# Patient Record
Sex: Female | Born: 1990 | Race: White | Hispanic: No | Marital: Married | Smoking: Former smoker
Health system: Southern US, Community
[De-identification: ages and names within clinical notes are randomized; demographics above are authoritative.]

## PROBLEM LIST (undated history)

## (undated) DIAGNOSIS — R519 Headache, unspecified: Secondary | ICD-10-CM

## (undated) DIAGNOSIS — R569 Unspecified convulsions: Secondary | ICD-10-CM

## (undated) DIAGNOSIS — R51 Headache: Secondary | ICD-10-CM

## (undated) HISTORY — DX: Headache, unspecified: R51.9

## (undated) HISTORY — DX: Headache: R51

## (undated) HISTORY — PX: TONSILLECTOMY: SUR1361

## (undated) HISTORY — PX: FRACTURE SURGERY: SHX138

---

## 2008-02-20 ENCOUNTER — Emergency Department (HOSPITAL_BASED_OUTPATIENT_CLINIC_OR_DEPARTMENT_OTHER): Admission: EM | Admit: 2008-02-20 | Discharge: 2008-02-20 | Payer: Self-pay | Admitting: Emergency Medicine

## 2008-03-06 ENCOUNTER — Emergency Department (HOSPITAL_BASED_OUTPATIENT_CLINIC_OR_DEPARTMENT_OTHER): Admission: EM | Admit: 2008-03-06 | Discharge: 2008-03-06 | Payer: Self-pay | Admitting: Emergency Medicine

## 2009-09-23 ENCOUNTER — Ambulatory Visit: Payer: Self-pay | Admitting: Interventional Radiology

## 2009-09-23 ENCOUNTER — Emergency Department (HOSPITAL_BASED_OUTPATIENT_CLINIC_OR_DEPARTMENT_OTHER): Admission: EM | Admit: 2009-09-23 | Discharge: 2009-09-23 | Payer: Self-pay | Admitting: Emergency Medicine

## 2010-09-26 LAB — BASIC METABOLIC PANEL
CO2: 25 mEq/L (ref 19–32)
Calcium: 9.5 mg/dL (ref 8.4–10.5)
Chloride: 108 mEq/L (ref 96–112)
Creatinine, Ser: 0.7 mg/dL (ref 0.4–1.2)
GFR calc non Af Amer: >60 mL/min (ref >60)
Glucose, Bld: 86 mg/dL (ref 70–99)
Potassium: 3.9 mEq/L (ref 3.5–5.1)

## 2010-09-26 LAB — CK: Total CK: 114 U/L (ref 7–177)

## 2016-07-23 ENCOUNTER — Emergency Department (HOSPITAL_BASED_OUTPATIENT_CLINIC_OR_DEPARTMENT_OTHER): Payer: Medicaid Other

## 2016-07-23 ENCOUNTER — Emergency Department (HOSPITAL_BASED_OUTPATIENT_CLINIC_OR_DEPARTMENT_OTHER)
Admission: EM | Admit: 2016-07-23 | Discharge: 2016-07-23 | Disposition: A | Discharge status: Home / Self Care | Payer: Medicaid Other | Attending: Emergency Medicine | Admitting: Emergency Medicine

## 2016-07-23 ENCOUNTER — Encounter (HOSPITAL_BASED_OUTPATIENT_CLINIC_OR_DEPARTMENT_OTHER): Payer: Self-pay | Admitting: Emergency Medicine

## 2016-07-23 DIAGNOSIS — W010XXA Fall on same level from slipping, tripping and stumbling without subsequent striking against object, initial encounter: Secondary | ICD-10-CM | POA: Diagnosis not present

## 2016-07-23 DIAGNOSIS — Y999 Unspecified external cause status: Secondary | ICD-10-CM | POA: Diagnosis not present

## 2016-07-23 DIAGNOSIS — Y92009 Unspecified place in unspecified non-institutional (private) residence as the place of occurrence of the external cause: Secondary | ICD-10-CM | POA: Insufficient documentation

## 2016-07-23 DIAGNOSIS — S99911A Unspecified injury of right ankle, initial encounter: Secondary | ICD-10-CM | POA: Diagnosis present

## 2016-07-23 DIAGNOSIS — M25571 Pain in right ankle and joints of right foot: Secondary | ICD-10-CM | POA: Diagnosis not present

## 2016-07-23 DIAGNOSIS — Y939 Activity, unspecified: Secondary | ICD-10-CM | POA: Diagnosis not present

## 2016-07-23 DIAGNOSIS — Z79899 Other long term (current) drug therapy: Secondary | ICD-10-CM | POA: Diagnosis not present

## 2016-07-23 HISTORY — DX: Unspecified convulsions: R56.9

## 2016-07-23 MED ORDER — TRAMADOL HCL 50 MG PO TABS: 50.0000 mg | ORAL_TABLET | Freq: Four times a day (QID) | ORAL | 0 refills | Status: DC | PRN

## 2016-07-23 MED ORDER — IBUPROFEN 400 MG PO TABS
400.0000 mg | ORAL_TABLET | Freq: Once | ORAL | Status: AC | PRN
  Administered 2016-07-23: 400 mg via ORAL
  Filled 2016-07-23: qty 1

## 2016-07-23 MED ORDER — DICLOFENAC SODIUM 50 MG PO TBEC: 50.0000 mg | DELAYED_RELEASE_TABLET | Freq: Two times a day (BID) | ORAL | 0 refills | Status: DC

## 2016-07-23 NOTE — ED Notes (Signed)
Pt given d/c instructions as per chart. Rx x 2 with precautions. Verbalizes understanding. No questions. 

## 2016-07-23 NOTE — ED Notes (Signed)
Pt states she slipped on the ice Thursday and injured her right ankle. C/O pain to same. Hope, NP in with pt.

## 2016-07-23 NOTE — ED Notes (Signed)
ED Provider at bedside. 

## 2016-07-23 NOTE — ED Provider Notes (Signed)
[facility]   CSN: [id number] Arrival date & time: 07/23/16  1834  By signing my name below, I, Sheri Perry, attest that this documentation has been prepared under the direction and in the presence of  [facility] M. Damian LeavellNeese, NP. Electronically Signed: Doreatha MartinEva Perry, ED Scribe. 07/23/16. 10:03 PM.    History   Chief Complaint Chief Complaint  Patient presents with  . Ankle Pain    HPI Sheri Perry is a 26 y.o. female who presents to the Emergency Department complaining of moderate, sharp right ankle pain s/p fall that occurred 2 days ago. Pt states she slipped in the snow, twisted her right ankle and fell, causing her injury. She denies LOC or head injury. Pt states pain is worsened with weight bearing and ambulation. She reports she has been taking Tylenol with out adequate control of pain. She denies additional injuries.   The history is provided by the patient. No language interpreter was used.  Ankle Pain   The incident occurred 2 days ago. The incident occurred at home. The injury mechanism was a fall. The pain is present in the right ankle. The quality of the pain is described as sharp. The pain is moderate. The pain has been constant since onset. Pertinent negatives include no inability to bear weight. The symptoms are aggravated by bearing weight, activity and palpation. She has tried nothing for the symptoms. The treatment provided no relief.    Past Medical History:  Diagnosis Date  . Seizure (HCC)     There are no active problems to display for this patient.   Past Surgical History:  Procedure Laterality Date  . FRACTURE SURGERY    . TONSILLECTOMY      OB History    No data available       Home Medications    Prior to Admission medications   Medication Sig Start Date End Date Taking? Authorizing Provider  amitriptyline (ELAVIL) 10 MG tablet Take 10 mg by mouth at bedtime.   Yes Historical Provider, MD  gabapentin (NEURONTIN) 100 MG  capsule Take 100 mg by mouth 3 (three) times daily.   Yes Historical Provider, MD  diclofenac (VOLTAREN) 50 MG EC tablet Take 1 tablet (50 mg total) by mouth 2 (two) times daily. 07/23/16   Sammi Stolarz Orlene OchM Anab Vivar, NP  traMADol (ULTRAM) 50 MG tablet Take 1 tablet (50 mg total) by mouth every 6 (six) hours as needed. 07/23/16   Jilliann Subramanian Orlene OchM Danylah Holden, NP    Family History No family history on file.  Social History Social History  Substance Use Topics  . Smoking status: Never Smoker  . Smokeless tobacco: Never Used     Comment: vape inhale  . Alcohol use No     Allergies   Latuda [lurasidone hcl]   Review of Systems Review of Systems  Musculoskeletal: Positive for arthralgias.       Right ankle pain  Neurological: Negative for syncope.  All other systems reviewed and are negative.    Physical Exam Updated Vital Signs BP 122/80 (BP Location: Right Arm)   Pulse 108   Temp 98.2 F (36.8 C) (Oral)   Resp 16   Ht 5\' 5"  (1.651 m)   Wt 88.5 kg   SpO2 98%   BMI 32.45 kg/m   Physical Exam  Constitutional: She appears well-developed and well-nourished.  HENT:  Head: Normocephalic.  Eyes: Conjunctivae are normal.  Cardiovascular: Normal rate.   Pulmonary/Chest: Effort normal. No respiratory distress.  Abdominal: She exhibits  no distension.  Musculoskeletal: Normal range of motion. She exhibits tenderness.  Pedal pulses 2+. Adequate circulation. Tenderness over the lateral aspect of the right ankle.   Neurological: She is alert.  Skin: Skin is warm and dry.  Psychiatric: She has a normal mood and affect. Her behavior is normal.  Nursing note and vitals reviewed.    ED Treatments / Results   DIAGNOSTIC STUDIES: Oxygen Saturation is 98% on RA, normal by my interpretation.    COORDINATION OF CARE: 9:56 PM Discussed treatment plan with pt at bedside which includes XR and pt agreed to plan.    Labs (all labs ordered are listed, but only abnormal results are displayed) Labs Reviewed - No  data to display   Radiology Dg Ankle Complete Right  Result Date: 07/23/2016 CLINICAL DATA:  [place], twisted ankle EXAM: RIGHT ANKLE - COMPLETE 3+ VIEW COMPARISON:  None. FINDINGS: Ankle mortise intact. The talar dome is normal. No malleolar fracture. The calcaneus is normal. IMPRESSION: No fracture or dislocation. Electronically Signed   By: Genevive Bi M.D.   On: 07/23/2016 19:57    Procedures Procedures (including critical care time)  Medications Ordered in ED Medications  ibuprofen (ADVIL,MOTRIN) tablet 400 mg (400 mg Oral Given 07/23/16 2055)     Initial Impression / Assessment and Plan / ED Course  I have reviewed the triage vital signs and the nursing notes.  Pertinent imaging results that were available during my care of the patient were reviewed by me and considered in my medical decision making (see chart for details).   Patient X-Ray negative for obvious fracture or dislocation.  Pt advised to follow up with PCP as needed or with worsening symptoms for orthopedic referral. Patient given Lenora Boys splint and crutches while in ED, conservative therapy recommended and discussed. Patient will be discharged home & is agreeable with above plan. Returns precautions discussed. Pt appears safe for discharge.   Final Clinical Impressions(s) / ED Diagnoses   Final diagnoses:  Acute right ankle pain    New Prescriptions Discharge Medication List as of 07/23/2016 10:14 PM    START taking these medications   Details  diclofenac (VOLTAREN) 50 MG EC tablet Take 1 tablet (50 mg total) by mouth 2 (two) times daily., Starting Sat 07/23/2016, Print    traMADol (ULTRAM) 50 MG tablet Take 1 tablet (50 mg total) by mouth every 6 (six) hours as needed., Starting Sat 07/23/2016, Print        I personally performed the services described in this documentation, which was scribed in my presence. The recorded information has been reviewed and is accurate.    [address] [place],  [place] 07/24/16 4132    Rolan Bucco, MD 07/24/16 [phone]

## 2016-07-23 NOTE — ED Triage Notes (Signed)
Pt slipped on ice on Thursday, injuring right ankle.  Swelling and bruising noted to lateral right ankle.

## 2017-02-28 ENCOUNTER — Encounter (HOSPITAL_COMMUNITY): Payer: Self-pay | Admitting: Emergency Medicine

## 2017-02-28 ENCOUNTER — Emergency Department (HOSPITAL_COMMUNITY)
Admission: EM | Admit: 2017-02-28 | Discharge: 2017-02-28 | Disposition: A | Discharge status: Home / Self Care | Payer: Medicaid Other | Attending: Emergency Medicine | Admitting: Emergency Medicine

## 2017-02-28 DIAGNOSIS — F445 Conversion disorder with seizures or convulsions: Secondary | ICD-10-CM | POA: Diagnosis not present

## 2017-02-28 DIAGNOSIS — Z79899 Other long term (current) drug therapy: Secondary | ICD-10-CM | POA: Insufficient documentation

## 2017-02-28 DIAGNOSIS — G43009 Migraine without aura, not intractable, without status migrainosus: Secondary | ICD-10-CM | POA: Insufficient documentation

## 2017-02-28 DIAGNOSIS — R4182 Altered mental status, unspecified: Secondary | ICD-10-CM | POA: Diagnosis present

## 2017-02-28 LAB — CBG MONITORING, ED: GLUCOSE-CAPILLARY: 97 mg/dL (ref 65–99)

## 2017-02-28 MED ORDER — DEXAMETHASONE 2 MG PO TABS
ORAL_TABLET | ORAL | Status: AC
  Filled 2017-02-28: qty 5

## 2017-02-28 MED ORDER — ACETAMINOPHEN 500 MG PO TABS
1000.0000 mg | ORAL_TABLET | Freq: Once | ORAL | Status: AC
  Administered 2017-02-28: 1000 mg via ORAL
  Filled 2017-02-28: qty 2

## 2017-02-28 MED ORDER — METOCLOPRAMIDE HCL 10 MG PO TABS
10.0000 mg | ORAL_TABLET | Freq: Once | ORAL | Status: AC
  Administered 2017-02-28: 10 mg via ORAL
  Filled 2017-02-28: qty 1

## 2017-02-28 MED ORDER — DEXAMETHASONE 4 MG PO TABS
10.0000 mg | ORAL_TABLET | Freq: Once | ORAL | Status: AC
  Administered 2017-02-28: 10 mg via ORAL

## 2017-02-28 NOTE — ED Triage Notes (Addendum)
Per EMS, pt noted wandering confused around office, speaking nonsensically x 5-10 minutes today, dizziness, photophobia, headache onset today. Pt does not remember episode. Hx of many similar episodes. Currently feeling mental cloudiness, which is normal after similar episodes. Pt has chiari malformation and hx pseudoseizures x 2-3 years. CN II-XII grossly intact to exam. Sensation symmetrical bilaterally.

## 2017-02-28 NOTE — ED Provider Notes (Signed)
[facility]-EMERGENCY DEPT Provider Note   CSN: [id number] Arrival date & time: 02/28/17  0944     History   Chief Complaint Chief Complaint  Patient presents with  . Altered Mental Status    HPI Sheri Perry is a 26 y.o. female.  The history is provided by the patient and a parent.  26 year old female with a history of depression, anxiety, Chiari malformation 1, pseudoseizures presents to the emergency department for possible seizure-like activity. This occurred approximately 8 hours prior to arrival. Patient was at her new job, training, when she felt headache coming on, photophobia, and blacked out. According to the bystanders patient had no change in her mental status and was minimally responsive and shaking. No urinary or bowel incontinence, no tongue biting. Patient denied any recent fevers or infections. Denied any recent head trauma. Currently endorses a typical migraine headache which are associated with these types of episodes. No other alleviating or aggravating factors. Denies any other physical complaints.  On Amitriptyline 70 -90 mg nightly and gabapentin for Chiari Malformation I.     Past Medical History:  Diagnosis Date  . Seizure (HCC)     There are no active problems to display for this patient.   Past Surgical History:  Procedure Laterality Date  . FRACTURE SURGERY    . TONSILLECTOMY      OB History    No data available       Home Medications    Prior to Admission medications   Medication Sig Start Date End Date Taking? Authorizing Provider  amitriptyline (ELAVIL) 10 MG tablet Take 90 mg by mouth at bedtime.    Yes [provider]  clobetasol ointment (TEMOVATE) 0.05 % Apply 1 application topically 2 (two) times daily as needed (psoriasis).    Yes [provider]  gabapentin (NEURONTIN) 300 MG capsule Take 600 mg by mouth 3 (three) times daily.   Yes [provider]  omeprazole (PRILOSEC) 20 MG capsule Take 20 mg by mouth  daily.   Yes [provider]  diclofenac (VOLTAREN) 50 MG EC tablet Take 1 tablet (50 mg total) by mouth 2 (two) times daily. Patient not taking: Reported on 02/28/2017 07/23/16   Janne Napoleon, NP  traMADol (ULTRAM) 50 MG tablet Take 1 tablet (50 mg total) by mouth every 6 (six) hours as needed. Patient not taking: Reported on 02/28/2017 07/23/16   Janne Napoleon, NP    Family History History reviewed. No pertinent family history.  Social History Social History  Substance Use Topics  . Smoking status: Never Smoker  . Smokeless tobacco: Never Used     Comment: vape inhale  . Alcohol use No     Allergies   Hydrocodone and Latuda [lurasidone hcl]   Review of Systems Review of Systems  Constitutional: Negative for chills and fever.  HENT: Negative for ear pain and sore throat.   Eyes: Negative for pain and visual disturbance.  Respiratory: Negative for cough and shortness of breath.   Cardiovascular: Negative for chest pain and palpitations.  Gastrointestinal: Negative for abdominal pain and vomiting.  Genitourinary: Negative for dysuria and hematuria.  Musculoskeletal: Negative for arthralgias and back pain.  Skin: Negative for color change and rash.  Neurological: Positive for headaches. Negative for seizures and syncope.  All other systems reviewed and are negative.    Physical Exam Updated Vital Signs BP 109/89 (BP Location: Left Arm)   Pulse (!) 110   Temp 98.1 F (36.7 C) (Oral)   Resp  18   Ht 5\' 5"  (1.651 m)   Wt 86 kg (189 lb 8 oz)   SpO2 100%   BMI 31.53 kg/m   Physical Exam  Constitutional: She is oriented to person, place, and time. She appears well-developed and well-nourished. No distress.  HENT:  Head: Normocephalic and atraumatic.  Nose: Nose normal.  Eyes: Pupils are equal, round, and reactive to light. Conjunctivae and EOM are normal. Right eye exhibits no discharge. Left eye exhibits no discharge. No scleral icterus.  Fundoscopic exam:       The right eye shows no papilledema.       The left eye shows no papilledema.  Neck: Normal range of motion. Neck supple.  Cardiovascular: Normal rate and regular rhythm.  Exam reveals no gallop and no friction rub.   No murmur heard. Pulmonary/Chest: Effort normal and breath sounds normal. No stridor. No respiratory distress. She has no rales.  Abdominal: Soft. She exhibits no distension. There is no tenderness.  Musculoskeletal: She exhibits no edema or tenderness.  Neurological: She is alert and oriented to person, place, and time.  Mental Status: Alert and oriented to person, place, and time. Attention and concentration normal. Speech clear. Recent memory is intact  Cranial Nerves  II Visual Fields: Intact to confrontation. Visual fields intact. III, IV, VI: Pupils equal and reactive to light and near. Full eye movement without nystagmus  V Facial Sensation: Normal. No weakness of masticatory muscles  VII: No facial weakness or asymmetry  VIII Auditory Acuity: Grossly normal  IX/X: The uvula is midline; the palate elevates symmetrically  XI: Normal sternocleidomastoid and trapezius strength  XII: The tongue is midline. No atrophy or fasciculations.   Motor System: Muscle Strength: 5/5 and symmetric in the upper and lower extremities. No pronation or drift.  Muscle Tone: Tone and muscle bulk are normal in the upper and lower extremities.   Reflexes: DTRs: 1+ and symmetrical in all four extremities. Plantar responses are flexor bilaterally.  Coordination: Intact finger-to-nose, heel-to-shin. No tremor.  Sensation: Intact to light touch, and pinprick. Negative Romberg test.  Gait: Routine gait normal.    Skin: Skin is warm and dry. No rash noted. She is not diaphoretic. No erythema.  Psychiatric: She has a normal mood and affect.  Vitals reviewed.    ED Treatments / Results  Labs (all labs ordered are listed, but only abnormal results are displayed) Labs Reviewed  CBG  MONITORING, ED    EKG  EKG Interpretation  Date/Time:  Tuesday February 28 2017 17:27:59 EDT Ventricular Rate:  85 PR Interval:    QRS Duration: 106 QT Interval:  373 QTC Calculation: 444 R Axis:   50 Text Interpretation:  Sinus rhythm Low voltage, precordial leads Baseline wander in lead(s) I III aVL normal intervals NO BLOCKS NO STEMI Confirmed by Drema Pry [phone]) on 02/28/2017 5:56:08 PM       Radiology No results found.  Procedures Procedures (including critical care time)  Medications Ordered in ED Medications  dexamethasone (DECADRON) 2 MG tablet (  Not Given 02/28/17 1751)  acetaminophen (TYLENOL) tablet 1,000 mg (1,000 mg Oral Given 02/28/17 1722)  metoCLOPramide (REGLAN) tablet 10 mg (10 mg Oral Given 02/28/17 1722)  dexamethasone (DECADRON) tablet 10 mg (10 mg Oral Given 02/28/17 1723)     Initial Impression / Assessment and Plan / ED Course  I have reviewed the triage vital signs and the nursing notes.  Pertinent labs & imaging results that were available during my care of the  patient were reviewed by me and considered in my medical decision making (see chart for details).     UPT negative. CBG within normal limits. EKG without evidence of ischemia, dysrhythmia, blocks, prolonged intervals, Brugada, or epsilon waves.  Patient's symptoms appeared to be related to either complex migraine headaches versus psychogenic/stress related.  Patient is currently at her baseline.  Typical migraine headache for the pt. Non focal neuro exam. No recent head trauma. No fever. Doubt meningitis. Doubt intracranial bleed. Doubt IIH. No indication for imaging. Will treat with migraine cocktail and reevaluate.  The patient is safe for discharge with strict return precautions.  Patient reports that she needs a referral for a new neurologist and would also like Behavioral Health referrals. We'll also provide patient with referral to cardiology for Holter monitor.  Final  Clinical Impressions(s) / ED Diagnoses   Final diagnoses:  Pseudoseizure  Migraine without aura and without status migrainosus, not intractable   Disposition: Discharge  Condition: Good  I have discussed the results, Dx and Tx plan with the patient who expressed understanding and agree(s) with the plan. Discharge instructions discussed at great length. The patient was given strict return precautions who verbalized understanding of the instructions. No further questions at time of discharge.    New Prescriptions   No medications on file    Follow Up: [facility] [address]     [address] [place] [address] [phone] Schedule an appointment as soon as possible for a visit     [facility] CARDIOVASC[facility] [address] [place] [address] [phone] Schedule an appointment as soon as possible for a visit  For Holter monitor      Cardama, Amadeo Garnet, MD 02/28/17 1840

## 2017-03-02 NOTE — Progress Notes (Signed)
Cardiology Office Note:    Date:  03/03/2017   ID:  Sheri Perry, DOB Dec 03, 1990, MRN [id number]  PCP:  Regino Bellowamos, Arlene G, MD  Cardiologist:  Norman HerrlichBrian Blonnie Maske, MD   Referring MD: No ref. provider found  ASSESSMENT:    1. Palpitation   2. LOC (loss of consciousness) (HCC)   3. Arnold-Chiari malformation (HCC)    PLAN:    In order of problems listed above:  1. Not a prominent symptom ,appears due to adrenergic blockade and standing lightheadedness Asrecommended by the ED show where Holter monitor and if unremarkable no further cardiac evaluation. 2. She presently does not have a neurologist I gave her referral. She takes multiple centrally active drugs and may be having side effect. 3. At her requestI gave her the name of the neurosurgeon.  Next appointment in 4 weeks    Medication Adjustments/Labs and Tests Ordered: Current medicines are reviewed at length with the patient today.  Concerns regarding medicines are outlined above.  Orders Placed This Encounter  Procedures  . Ambulatory referral to Neurology  . Holter monitor - 48 hour  . EKG 12-Lead   No orders of the defined types were placed in this encounter.    Chief Complaint  Patient presents with  . New Patient (Initial Visit)    per [facility] for cardiac evaluation  . Palpitations    History of Present Illness:    Sheri Perry is a 26 y.o. female with depression, anxiety, Chiari Type 1 malformation and  pseudoseizures  who is being seen today for the evaluation of loss of conscious at the request of [place] [facility] for a Holter monitor.  ED Triage Note: Per EMS, pt noted wandering confused around office, speaking nonsensically x 5-10 minutes today, dizziness, photophobia, headache onset today. Pt does not remember episode. Hx of many similar episodes. Currently feeling mental cloudiness, which is normal after similar episodes.  Prior to her  I reviewed multiple neurologic and ED evaluations and epic  including consultations and on [facility]. She has no history of heart disease. From her description she is not having syncope. She takes high-dose amitriptyline and has postural lightheadedness and palpitation she's not had chest pain shortness of breath syncope or TIA.  She takes multiple centrally active drugs. She has Chiari malformation. Past Medical History:  Diagnosis Date  . Seizure [facility](HCC)     Past Surgical History:  Procedure Laterality Date  . FRACTURE SURGERY    . TONSILLECTOMY      Current Medications: Current Meds  Medication Sig  . amitriptyline (ELAVIL) 50 MG tablet Take 350 mg by mouth at bedtime.   . clobetasol ointment (TEMOVATE) 0.05 % Apply 1 application topically 2 (two) times daily as needed (psoriasis).   Marland Kitchen. diclofenac (VOLTAREN) 50 MG EC tablet Take 1 tablet (50 mg total) by mouth 2 (two) times daily.  Marland Kitchen. gabapentin (NEURONTIN) 300 MG capsule Take 300 mg by mouth 3 (three) times daily.   Marland Kitchen. omeprazole (PRILOSEC) 20 MG capsule Take 20 mg by mouth daily.  . traMADol (ULTRAM) 50 MG tablet Take 1 tablet (50 mg total) by mouth every 6 (six) hours as needed.     Allergies:   Hydrocodone and Latuda [lurasidone hcl]   Social History   Social History  . Marital status: Single    Spouse name: N/A  . Number of children: N/A  . Years of education: N/A   Social History Main Topics  . Smoking status: Former Smoker  Packs/day: 2.00  . Smokeless tobacco: Never Used     Comment: quit 5 years ago   . Alcohol use No  . Drug use: No  . Sexual activity: Not Asked   Other Topics Concern  . None   Social History Narrative  . None     Family History: The patient's family history is not on file. She was adopted.  ROS:   ROS Please see the history of present illness.     All other systems reviewed and are negative.  EKGs/Labs/Other Studies Reviewed:    The following studies were reviewed today:   EKG:  EKG 08/29/18demonstrates SRTH normal  QTc, normal EKG EKG today sinus tachycardia, QTc is normal Recent Labs: No results found for requested labs within last 8760 hours.  Recent Lipid Panel No results found for: CHOL, TRIG, HDL, CHOLHDL, VLDL, LDLCALC, LDLDIRECT  Physical Exam:    VS:  BP 124/88 (BP Location: Left Arm, Patient Position: Sitting)   Pulse 98   Ht 5\' 5"  (1.651 m)   Wt 194 lb (88 kg)   SpO2 98%   BMI 32.28 kg/m     Wt Readings from Last 3 Encounters:  03/03/17 194 lb (88 kg)  02/28/17 189 lb 8 oz (86 kg)  07/23/16 195 lb (88.5 kg)    During my evaluation sitting she became lightheaded and recovered supine. She has an unusual high pitched Minnie mouse voice GEN:  Well nourished, well developed in no acute distress HEENT: Normal NECK: No JVD; No carotid bruits LYMPHATICS: No lymphadenopathy CARDIAC: RRR, no murmurs, rubs, gallops RESPIRATORY:  Clear to auscultation without rales, wheezing or rhonchi  ABDOMEN: Soft, non-tender, non-distended MUSCULOSKELETAL:  No edema; No deformity  SKIN: Warm and dry NEUROLOGIC:  Alert and oriented x 3 PSYCHIATRIC:  Normal affect     Signed, Norman Herrlich, MD  03/03/2017 11:51 AM    [facility]

## 2017-03-03 ENCOUNTER — Ambulatory Visit (INDEPENDENT_AMBULATORY_CARE_PROVIDER_SITE_OTHER): Payer: Medicaid Other | Admitting: Cardiology

## 2017-03-03 ENCOUNTER — Encounter: Payer: Self-pay | Admitting: Cardiology

## 2017-03-03 VITALS — BP 124/88 | HR 98 | Ht 65.0 in | Wt 194.0 lb

## 2017-03-03 DIAGNOSIS — R002 Palpitations: Secondary | ICD-10-CM

## 2017-03-03 DIAGNOSIS — Q0700 Arnold-Chiari syndrome without spina bifida or hydrocephalus: Secondary | ICD-10-CM | POA: Diagnosis not present

## 2017-03-03 DIAGNOSIS — R4020 Unspecified coma: Secondary | ICD-10-CM | POA: Diagnosis not present

## 2017-03-03 NOTE — Patient Instructions (Addendum)
Medication Instructions:  Your physician recommends that you continue on your current medications as directed. Please refer to the Current Medication list given to you today.   Labwork: None  Testing/Procedures: You had an EKG today.  Your physician has recommended that you wear a holter monitor. Holter monitors are medical devices that record the heart's electrical activity. Doctors most often use these monitors to diagnose arrhythmias. Arrhythmias are problems with the speed or rhythm of the heartbeat. The monitor is a small, portable device. You can wear one while you do your normal daily activities. This is usually used to diagnose what is causing palpitations/syncope (passing out).   Follow-Up: Your physician recommends that you schedule a follow-up appointment in: 4 weeks.  You will receive a phone call from neurology to schedule an appointment.   Any Other Special Instructions Will Be Listed Below (If Applicable).     If you need a refill on your cardiac medications before your next appointment, please call your pharmacy.    1. Avoid all over-the-counter antihistamines except Claritin/Loratadine and Zyrtec/Cetrizine. 2. Avoid all combination including cold sinus allergies flu decongestant and sleep medications 3. You can use Robitussin DM Mucinex and Mucinex DM for cough. 4. can use Tylenol aspirin ibuprofen and naproxen but no combinations such as sleep or sinus.  Add salt to your diet and drink 3 liters of fluid or more daily

## 2017-03-09 ENCOUNTER — Ambulatory Visit: Payer: Medicaid Other

## 2017-03-09 DIAGNOSIS — R4020 Unspecified coma: Secondary | ICD-10-CM

## 2017-03-31 ENCOUNTER — Ambulatory Visit: Payer: Self-pay | Admitting: Cardiology

## 2017-04-06 ENCOUNTER — Ambulatory Visit: Payer: Self-pay | Admitting: Cardiology

## 2017-04-12 NOTE — Progress Notes (Deleted)
  Cardiology Office Note:    Date:  04/12/2017   ID:  Sheri Perry, DOB 12/10/1990, MRN [id number]  PCP:  Regino Bellow, MD  Cardiologist:  Norman Herrlich, MD    Referring MD: Regino Bellow, MD    ASSESSMENT:    No diagnosis found. PLAN:    In order of problems listed above:  1. ***   Next appointment: ***   Medication Adjustments/Labs and Tests Ordered: Current medicines are reviewed at length with the patient today.  Concerns regarding medicines are outlined above.  No orders of the defined types were placed in this encounter.  No orders of the defined types were placed in this encounter.   No chief complaint on file.   History of Present Illness:    Sheri Perry is a 26 y.o. 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 Compliance with diet, lifestyle and medications: *** Past Medical History:  Diagnosis Date  . Seizure [facility])     Past Surgical History:  Procedure Laterality Date  . FRACTURE SURGERY    . TONSILLECTOMY      Current Medications: No outpatient prescriptions have been marked as taking for the 04/13/17 encounter (Appointment) with Baldo Daub, MD.     Allergies:   Hydrocodone and Oscar La hcl]   Social History   Social History  . Marital status: Single    Spouse name: N/A  . Number of children: N/A  . Years of education: N/A   Social History Main Topics  . Smoking status: Former Smoker    Packs/day: 2.00  . Smokeless tobacco: Never Used     Comment: quit 5 years ago   . Alcohol use No  . Drug use: No  . Sexual activity: Not on file   Other Topics Concern  . Not on file   Social History Narrative  . No narrative on file     Family History: The patient's ***family history is not on file. She was adopted. ROS:   Please see the history of present illness.    All other systems reviewed and are negative.  EKGs/Labs/Other Studies Reviewed:     The following studies were reviewed today:  EKG:  EKG ordered today.  The ekg ordered today demonstrates ***  Recent Labs: No results found for requested labs within last 8760 hours.  Recent Lipid Panel No results found for: CHOL, TRIG, HDL, CHOLHDL, VLDL, LDLCALC, LDLDIRECT  Physical Exam:    VS:  There were no vitals taken for this visit.    Wt Readings from Last 3 Encounters:  03/03/17 194 lb (88 kg)  02/28/17 189 lb 8 oz (86 kg)  07/23/16 195 lb (88.5 kg)     GEN: *** Well nourished, well developed in no acute distress HEENT: Normal NECK: No JVD; No carotid bruits LYMPHATICS: No lymphadenopathy CARDIAC: ***RRR, no murmurs, rubs, gallops RESPIRATORY:  Clear to auscultation without rales, wheezing or rhonchi  ABDOMEN: Soft, non-tender, non-distended MUSCULOSKELETAL:  No edema; No deformity  SKIN: Warm and dry NEUROLOGIC:  Alert and oriented x 3 PSYCHIATRIC:  Normal affect    Signed, Norman Herrlich, MD  04/12/2017 1:43 PM    [facility]

## 2017-04-13 ENCOUNTER — Ambulatory Visit: Payer: Self-pay | Admitting: Cardiology

## 2017-04-19 ENCOUNTER — Ambulatory Visit (INDEPENDENT_AMBULATORY_CARE_PROVIDER_SITE_OTHER): Payer: Medicaid Other | Admitting: Diagnostic Neuroimaging

## 2017-04-19 ENCOUNTER — Encounter: Payer: Self-pay | Admitting: Diagnostic Neuroimaging

## 2017-04-19 VITALS — BP 126/80 | HR 111 | Ht 65.0 in | Wt 199.0 lb

## 2017-04-19 DIAGNOSIS — F4310 Post-traumatic stress disorder, unspecified: Secondary | ICD-10-CM | POA: Diagnosis not present

## 2017-04-19 DIAGNOSIS — F419 Anxiety disorder, unspecified: Secondary | ICD-10-CM

## 2017-04-19 DIAGNOSIS — F329 Major depressive disorder, single episode, unspecified: Secondary | ICD-10-CM | POA: Diagnosis not present

## 2017-04-19 DIAGNOSIS — R569 Unspecified convulsions: Secondary | ICD-10-CM

## 2017-04-19 DIAGNOSIS — F32A Depression, unspecified: Secondary | ICD-10-CM

## 2017-04-19 DIAGNOSIS — G43111 Migraine with aura, intractable, with status migrainosus: Secondary | ICD-10-CM | POA: Diagnosis not present

## 2017-04-19 DIAGNOSIS — F445 Conversion disorder with seizures or convulsions: Secondary | ICD-10-CM | POA: Diagnosis not present

## 2017-04-19 MED ORDER — GABAPENTIN 300 MG PO CAPS: 300.0000 mg | ORAL_CAPSULE | Freq: Three times a day (TID) | ORAL | 12 refills | Status: DC

## 2017-04-19 MED ORDER — AMITRIPTYLINE HCL 50 MG PO TABS: 50.0000 mg | ORAL_TABLET | Freq: Every day | ORAL | 12 refills | Status: DC

## 2017-04-19 NOTE — Patient Instructions (Addendum)
  MIGRAINE WITH AURA - gradually taper amitriptyline to 50-100mg  at bedtime (reduce by 50mg  per week) - continue gabapentin 600mg  three times a day - may consider topiramate or zonisamide in future - may consider sumatriptan or rizatriptan in future  NON-EPILEPTIC SPELLS / PSEUDOSEIZURES - consider psychology / psychiatry evaluation - encouraged improved nutrition, exercise, relaxation techniques

## 2017-04-19 NOTE — Progress Notes (Signed)
[facility]  PATIENT: Sheri Perry DOB: [birth year]  REFERRING CLINICIAN: ER  HISTORY FROM: patient and father and chart review REASON FOR VISIT: new consult    HISTORICAL  CHIEF COMPLAINT:  Chief Complaint  Patient presents with  . Seizures    rm 7, New pt, ED referral, father- Corene Cornea, "seizures for three years; have daily headache"    HISTORY OF PRESENT ILLNESS:   26 year old female here for evaluation of pseudoseizures, headache, dizziness.  June 2015 patient was admitted to [facility] for severe tremors, stuttering, intermittent loss of consciousness, difficulty walking. 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  Patient also has had intermittent migraine since age 74 years old. Patient describes severe pain, front or back of head, with dizziness, speech difficulty, nausea, photophobia, phonophobia, hearing change. Headaches may occur on a daily basis for the past 6 months. Computer screens, noise, mayonnaise and other factors such as decreased caffeine can sometimes trigger headaches. Menstrual cycle, PTSD and anxiety also aggravate headaches. Patient was treated with amitriptyline by previous neurologist and titrated up to 350 mg at bedtime. Patient also was tried on topiramate in the past.  In the past patient was recommended to follow-up with psychiatry and psychology. She has not followed up with them in several years.  On 02/28/17 patient was at a new job, training, then felt headache and passed out. No definite convulsions. No tongue biting or incontinence. Patient was evaluated in the emergency room and then discharged home.  Patient also reports history of "Chiari malformation".   REVIEW OF SYSTEMS: Full 14 system review of systems performed and negative with exception of: Memory loss confusion headache  blurred vision weight gain fatigue depression anxiety disinterest in activities.  ALLERGIES: Allergies  Allergen Reactions  . Hydrocodone     hallucinations  . [name] [Lurasidone Hcl] Other (See Comments)    hallucinations    HOME MEDICATIONS: Outpatient Medications Prior to Visit  Medication Sig Dispense Refill  . amitriptyline (ELAVIL) 50 MG tablet Take 350 mg by mouth at bedtime.     . clobetasol ointment (TEMOVATE) 2.53 % Apply 1 application topically 2 (two) times daily as needed (psoriasis).     Marland Kitchen diclofenac (VOLTAREN) 50 MG EC tablet Take 1 tablet (50 mg total) by mouth 2 (two) times daily. 15 tablet 0  . gabapentin (NEURONTIN) 300 MG capsule Take 300 mg by mouth 3 (three) times daily.     Marland Kitchen omeprazole (PRILOSEC) 20 MG capsule Take 20 mg by mouth daily.    . traMADol (ULTRAM) 50 MG tablet Take 1 tablet (50 mg total) by mouth every 6 (six) hours as needed. (Patient not taking: Reported on 04/19/2017) 15 tablet 0   No facility-administered medications prior to visit.     PAST MEDICAL HISTORY: Past Medical History:  Diagnosis Date  . Headache    "headaches daily"  . Seizure ([place])    since 2015    PAST SURGICAL HISTORY: Past Surgical History:  Procedure Laterality Date  . FRACTURE SURGERY Right    elbow  . TONSILLECTOMY      FAMILY HISTORY: Family History  Problem Relation Age of Onset  . Adopted: Yes    SOCIAL HISTORY:  Social History   Social History  . Marital status: Single    Spouse name: N/A  . Number of children: 1  . Years of education: 43   Occupational History  .  retail store   Social History Main Topics  . Smoking status: Former Smoker    Packs/day: 2.00    Quit date: 07/21/2011  . Smokeless tobacco: Never Used     Comment: quit 5 years ago   . Alcohol use No  . Drug use: No  . Sexual activity: Not on file   Other Topics Concern  . Not on file   Social History Narrative   Lives alone with son   Caffeine- coffee, 1-2 cups  daily, soda 2 daily     PHYSICAL EXAM  GENERAL EXAM/CONSTITUTIONAL: Vitals:  Vitals:   04/19/17 1018  BP: 126/80  Pulse: (!) 111  Weight: 199 lb (90.3 kg)  Height: '5\' 5"'  (1.651 m)     Body mass index is 33.12 kg/m.  Visual Acuity Screening   Right eye Left eye Both eyes  Without correction: 20/50 20/50   With correction:        Patient is in no distress; well developed, nourished and groomed; neck is supple  CARDIOVASCULAR:  Examination of carotid arteries is normal; no carotid bruits  Regular rate and rhythm, no murmurs  Examination of peripheral vascular system by observation and palpation is normal  EYES:  Ophthalmoscopic exam of optic discs and posterior segments is normal; no papilledema or hemorrhages  MUSCULOSKELETAL:  Gait, strength, tone, movements noted in Neurologic exam below  NEUROLOGIC: MENTAL STATUS:  No flowsheet data found.  awake, alert, oriented to person, place and time  recent and remote memory intact  normal attention and concentration  language fluent, comprehension intact, naming intact,   fund of knowledge appropriate  CRANIAL NERVE:   2nd - no papilledema on fundoscopic exam  2nd, 3rd, 4th, 6th - pupils equal and reactive to light, visual fields full to confrontation, extraocular muscles intact, no nystagmus  5th - facial sensation symmetric  7th - facial strength symmetric  8th - hearing intact  9th - palate elevates symmetrically, uvula midline  11th - shoulder shrug symmetric  12th - tongue protrusion midline  MOTOR:   normal bulk and tone, full strength in the BUE, BLE  SENSORY:   normal and symmetric to light touch, temperature, vibration  COORDINATION:   finger-nose-finger, fine finger movements normal  REFLEXES:   deep tendon reflexes present and symmetric  GAIT/STATION:   narrow based gait; romberg is negative    DIAGNOSTIC DATA (LABS, IMAGING, TESTING) - I reviewed patient records,  labs, notes, testing and imaging myself where available.  No results found for: WBC, HGB, HCT, MCV, PLT    Component Value Date/Time   NA 144 09/23/2009 2254   K 3.9 09/23/2009 2254   CL 108 09/23/2009 2254   CO2 25 09/23/2009 2254   GLUCOSE 86 09/23/2009 2254   BUN 5 (L) 09/23/2009 2254   CREATININE 0.7 09/23/2009 2254   CALCIUM 9.5 09/23/2009 2254   GFRNONAA >60 09/23/2009 2254   GFRAA  09/23/2009 2254    >60        The eGFR has been calculated using the MDRD equation. This calculation has not been validated in all clinical situations. eGFR's persistently <60 mL/min signify possible Chronic Kidney Disease.   No results found for: CHOL, HDL, LDLCALC, LDLDIRECT, TRIG, CHOLHDL No results found for: HGBA1C No results found for: VITAMINB12 No results found for: TSH   07/15/15 MRI brain [I reviewed images myself. No definite chiari malformation. I think patient has borderline cerebellar tonsillar ectopia. 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 year old female here with history of depression, anxiety, PTSD, pseudoseizures, migraine headaches.   Meds tried for migraine: topiramate, amitriptyline, BC powder, Excedrin migraine  Meds tried for depression / anxiety: lexapro   Dx:  1. Intractable migraine with aura with status migrainosus   2. Pseudoseizures   3. PTSD (post-traumatic stress disorder)   4. Depression, unspecified depression type   5. Anxiety      PLAN:  MIGRAINE WITH AURA - gradually taper amitriptyline to 50-13m at bedtime (currently on very high dose 355mat bedtime per previous neurologisy; will plan to reduce by 5025mer day per week) - continue gabapentin 600m39mree times a day for headaches - may consider topiramate or zonisamide in future - may consider sumatriptan or rizatriptan in future  NON-EPILEPTIC SPELLS / PSEUDOSEIZURES - consider psychology /  psychiatry evaluation - encouraged improved nutrition, exercise, relaxation techniques  Meds ordered this encounter  Medications  . amitriptyline (ELAVIL) 50 MG tablet    Sig: Take 1-2 tablets (50-100 mg total) by mouth at bedtime.    Dispense:  60 tablet    Refill:  12  . gabapentin (NEURONTIN) 300 MG capsule    Sig: Take 1-2 capsules (300-600 mg total) by mouth 3 (three) times daily.    Dispense:  180 capsule    Refill:  12   Return in about 4 months (around 08/20/2017).    VIKRPenni Bombard 10/[id number] in Neurology, Neurophysiology and Neuroimaging  [facility] [address]ks[address]ille[address] [phone]

## 2017-08-21 ENCOUNTER — Encounter: Payer: Self-pay | Admitting: Diagnostic Neuroimaging

## 2017-08-21 ENCOUNTER — Ambulatory Visit: Payer: Medicaid Other | Admitting: Diagnostic Neuroimaging

## 2017-08-21 VITALS — BP 134/84 | HR 98 | Wt 194.0 lb

## 2017-08-21 DIAGNOSIS — F4310 Post-traumatic stress disorder, unspecified: Secondary | ICD-10-CM | POA: Diagnosis not present

## 2017-08-21 DIAGNOSIS — F329 Major depressive disorder, single episode, unspecified: Secondary | ICD-10-CM

## 2017-08-21 DIAGNOSIS — F445 Conversion disorder with seizures or convulsions: Secondary | ICD-10-CM | POA: Diagnosis not present

## 2017-08-21 DIAGNOSIS — G43111 Migraine with aura, intractable, with status migrainosus: Secondary | ICD-10-CM | POA: Diagnosis not present

## 2017-08-21 DIAGNOSIS — R569 Unspecified convulsions: Secondary | ICD-10-CM

## 2017-08-21 DIAGNOSIS — F32A Depression, unspecified: Secondary | ICD-10-CM

## 2017-08-21 DIAGNOSIS — F419 Anxiety disorder, unspecified: Secondary | ICD-10-CM | POA: Diagnosis not present

## 2017-08-21 MED ORDER — GABAPENTIN 300 MG PO CAPS: 300.0000 mg | ORAL_CAPSULE | Freq: Three times a day (TID) | ORAL | 12 refills | Status: DC

## 2017-08-21 MED ORDER — AMITRIPTYLINE HCL 50 MG PO TABS: 50.0000 mg | ORAL_TABLET | Freq: Every day | ORAL | 12 refills | Status: DC

## 2017-08-21 NOTE — Patient Instructions (Signed)
-   gradually decrease amitriptyline to 50-100mg  at bedtime

## 2017-08-21 NOTE — Progress Notes (Signed)
[facility]  PATIENT: Sheri Perry DOB: 05/11/1991  REFERRING CLINICIAN: ER  HISTORY FROM: patient and sister REASON FOR VISIT: follow up   HISTORICAL  CHIEF COMPLAINT:  Chief Complaint  Patient presents with  . Migraine    rm 7, sister- Tiffany, "headaches daily since lowering amitriptyline, no seizures in 6 months"  . Follow-up    4 month    HISTORY OF PRESENT ILLNESS:   UPDATE (08/21/17, VRP): Since last visit, doing well. Tolerating meds. No alleviating or aggravating factors. Mild HA continue daily, but migraines have reduced (2-3 per week). Has been able to reduce amitriptyline to 276m daily (previously 3565mdaily).  PRIOR HPI (27ear old female here for evaluation of pseudoseizures, headache, dizziness. [facility], [facility], intermittent loss of consciousness, difficulty walking. 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 Patient also has had intermittent migraine since age [age]ears old. Patient describes severe pain, front or back of head, with dizziness, speech difficulty, nausea, photophobia, phonophobia, hearing change. Headaches may occur on a daily basis for the past 6 months. Computer screens, noise, mayonnaise and other factors such as decreased caffeine can sometimes trigger headaches. Menstrual cycle, PTSD and anxiety also aggravate headaches. Patient was treated with amitriptyline by previous neurologist and titrated up to 350 mg at bedtime. Patient also was tried on topiramate in the past. In the past patient was recommended to follow-up with psychiatry and psychology. She has not followed up with them in several years. On 02/28/17 patient was at a new job, training, then felt headache and passed out. No definite convulsions. No tongue biting or  incontinence. Patient was evaluated in the emergency room and then discharged home. Patient also reports history of "Chiari malformation".   REVIEW OF SYSTEMS: Full 14 system review of systems performed and negative with exception of: dizziness headache tremors seizure.   ALLERGIES: Allergies  Allergen Reactions  . Bee Pollen Hives  . Hydrocodone Nausea And Vomiting    hallucinations  . LaAnette GuarneriLurasidone Hcl] Other (See Comments)    hallucinations    HOME MEDICATIONS: Outpatient Medications Prior to Visit  Medication Sig Dispense Refill  . amitriptyline (ELAVIL) 50 MG tablet Take 1-2 tablets (50-100 mg total) by mouth at bedtime. 60 tablet 12  . clobetasol ointment (TEMOVATE) 0.1.77 Apply 1 application topically 2 (two) times daily as needed (psoriasis).     . cyclobenzaprine (FLEXERIL) 10 MG tablet Take 10 mg by mouth as needed.    . diclofenac (VOLTAREN) 50 MG EC tablet Take 1 tablet (50 mg total) by mouth 2 (two) times daily. 15 tablet 0  . gabapentin (NEURONTIN) 300 MG capsule Take 1-2 capsules (300-600 mg total) by mouth 3 (three) times daily. 180 capsule 12  . lidocaine (LIDODERM) 5 % Apply patch to painful area. Patch may remain in place for up to 12 hours in a 24 hour period.    . Marland Kitchenmeprazole (PRILOSEC) 20 MG capsule Take 20 mg by mouth daily.     No facility-administered medications prior to visit.     PAST MEDICAL HISTORY: Past Medical History:  Diagnosis Date  . Headache    "headaches daily"  [place]    PAST SURGICAL HISTORY: Past Surgical History:  Procedure Laterality Date  . FRACTURE SURGERY Right    elbow  . TONSILLECTOMY  FAMILY HISTORY: Family History  Adopted: Yes    SOCIAL HISTORY:  Social History   Socioeconomic History  . Marital status: Single    Spouse name: Not on file  . Number of children: 1  . Years of education: 81  . Highest education level: Not on file  Social Needs  . Financial resource strain: Not  on file  . Food insecurity - worry: Not on file  . Food insecurity - inability: Not on file  . Transportation needs - medical: Not on file  . Transportation needs - non-medical: Not on file  Occupational History    Comment: retail store  Tobacco Use  . Smoking status: Former Smoker    Packs/day: 2.00    Last attempt to quit: 07/21/2011    Years since quitting: 6.0  . Smokeless tobacco: Never Used  . Tobacco comment: quit 5 years ago   Substance and Sexual Activity  . Alcohol use: No  . Drug use: No  . Sexual activity: Not on file  Other Topics Concern  . Not on file  Social History Narrative   Lives alone with son   Caffeine- coffee, 1-2 cups daily, soda 2 daily     PHYSICAL EXAM  GENERAL EXAM/CONSTITUTIONAL: Vitals:  Vitals:   08/21/17 1605  BP: 134/84  Pulse: 98  Weight: 194 lb (88 kg)   Body mass index is 32.28 kg/m. No exam data present  Patient is in no distress; well developed, nourished and groomed; neck is supple  CARDIOVASCULAR:  Examination of carotid arteries is normal; no carotid bruits  Regular rate and rhythm, no murmurs  Examination of peripheral vascular system by observation and palpation is normal  EYES:  Ophthalmoscopic exam of optic discs and posterior segments is normal; no papilledema or hemorrhages  MUSCULOSKELETAL:  Gait, strength, tone, movements noted in Neurologic exam below  NEUROLOGIC: MENTAL STATUS:  No flowsheet data found.  awake, alert, oriented to person, place and time  recent and remote memory intact  normal attention and concentration  language fluent, comprehension intact, naming intact,   fund of knowledge appropriate  CRANIAL NERVE:   2nd - no papilledema on fundoscopic exam  2nd, 3rd, 4th, 6th - pupils equal and reactive to light, visual fields full to confrontation, extraocular muscles intact, no nystagmus  5th - facial sensation symmetric  7th - facial strength symmetric  8th - hearing  intact  9th - palate elevates symmetrically, uvula midline  11th - shoulder shrug symmetric  12th - tongue protrusion midline  MOTOR:   normal bulk and tone, full strength in the BUE, BLE  SENSORY:   normal and symmetric to light touch, temperature, vibration  COORDINATION:   finger-nose-finger, fine finger movements normal  REFLEXES:   deep tendon reflexes present and symmetric  GAIT/STATION:   narrow based gait    DIAGNOSTIC DATA (LABS, IMAGING, TESTING) - I reviewed patient records, labs, notes, testing and imaging myself where available.  No results found for: WBC, HGB, HCT, MCV, PLT    Component Value Date/Time   NA 144 09/23/2009 2254   K 3.9 09/23/2009 2254   CL 108 09/23/2009 2254   CO2 25 09/23/2009 2254   GLUCOSE 86 09/23/2009 2254   BUN 5 (L) 09/23/2009 2254   CREATININE 0.7 09/23/2009 2254   CALCIUM 9.5 09/23/2009 2254   GFRNONAA >60 09/23/2009 2254   GFRAA  09/23/2009 2254    >60        The eGFR has been calculated using the  MDRD equation. This calculation has not been validated in all clinical situations. eGFR's persistently <60 mL/min signify possible Chronic Kidney Disease.   No results found for: CHOL, HDL, LDLCALC, LDLDIRECT, TRIG, CHOLHDL No results found for: HGBA1C No results found for: VITAMINB12 No results found for: TSH   07/15/15 MRI brain [I reviewed images myself. No definite chiari malformation. I think patient has borderline cerebellar tonsillar ectopia. -VRP]  - mild cerebellar tonsillar ectopia (3-9m); no definite chiari malformation     ASSESSMENT AND PLAN  27y.o. year old female here with history of depression, anxiety, PTSD, pseudoseizures, migraine headaches.   Meds tried for migraine: topiramate, amitriptyline, BC powder, Excedrin migraine  Meds tried for depression / anxiety: lexapro   Dx:  1. Intractable migraine with aura with status migrainosus   2. Pseudoseizures   3. PTSD (post-traumatic  stress disorder)   4. Depression, unspecified depression type   5. Anxiety      PLAN:  MIGRAINE WITH AURA - gradually taper amitriptyline to 50-1039mat bedtime (currently on high dose 25011mper previous neurologist; will plan to continue to reduce by 29m45mr day every other week) - continue gabapentin 600mg56mee times a day for headaches - may consider topiramate or zonisamide in future - may consider sumatriptan or rizatriptan in future  NON-EPILEPTIC SPELLS / PSEUDOSEIZURES - consider psychology / psychiatry evaluation - encouraged improved nutrition, exercise, relaxation techniques  Meds ordered this encounter  Medications  . gabapentin (NEURONTIN) 300 MG capsule    Sig: Take 1-2 capsules (300-600 mg total) by mouth 3 (three) times daily.    Dispense:  180 capsule    Refill:  12  . amitriptyline (ELAVIL) 50 MG tablet    Sig: Take 1-2 tablets (50-100 mg total) by mouth at bedtime.    Dispense:  60 tablet    Refill:  12   Return in about 6 months (around 02/18/2018).    VIKRAPenni Bombard2/18/4/[id number] 3:64ertified in Neurology, Neurophysiology and Neuroimaging  [facility] [address]te[address] [place] [phone]

## 2018-01-08 ENCOUNTER — Telehealth: Payer: Self-pay | Admitting: Diagnostic Neuroimaging

## 2018-01-08 NOTE — Telephone Encounter (Signed)
Patient has an appointment with Dr. Marjory LiesPenumalli on 02-19-18. She continues to have migraines and medication is not helping. Can she get a sooner appointment?

## 2018-01-08 NOTE — Telephone Encounter (Signed)
Spoke to pt and made appt with MM/NP to evaluate worsening migraines.  01-09-18 at 1430 arrive 1400.  Pt verbalized understanding.

## 2018-01-08 NOTE — Telephone Encounter (Signed)
Agree with plan. --VRP 

## 2018-01-08 NOTE — Telephone Encounter (Signed)
Spoke to pt and she is taking gabapentin 600mg  po TID, and amitriptyline 100mg  po qhs.  She stated that this worked for a time, but since last week has had an increase of migraines.  (not able to eat when has migraine, w/ nausea and noted was at beach last week, less stress and still had problems).   Asking to be seen.  Ok to see NP?

## 2018-01-09 ENCOUNTER — Ambulatory Visit: Payer: Medicaid Other | Admitting: Adult Health

## 2018-01-09 ENCOUNTER — Encounter: Payer: Self-pay | Admitting: Adult Health

## 2018-01-09 VITALS — BP 126/74 | HR 113 | Ht 65.0 in | Wt 190.0 lb

## 2018-01-09 DIAGNOSIS — G43111 Migraine with aura, intractable, with status migrainosus: Secondary | ICD-10-CM | POA: Diagnosis not present

## 2018-01-09 MED ORDER — PREDNISONE 5 MG PO TABS: ORAL_TABLET | ORAL | 0 refills | Status: DC

## 2018-01-09 NOTE — Patient Instructions (Signed)
Your Plan:  Continue Amitriptyline and gabapentin Start prednisone dose pak. If headache does improve call.  In the future we can consider Aimovig or Ajovy If your symptoms worsen or you develop new symptoms please let us know.    [facility]. I hope we have been able to provide you high quality care today.  You may receive a patient satisfaction survey over the next few weeks. We would appreciate your feedback and comments so that we may continue to improve ourselves and the health of our patients.

## 2018-01-09 NOTE — Progress Notes (Signed)
PATIENT: Sheri Perry DOB: [birth year]  REASON FOR VISIT: follow up HISTORY FROM: patient  HISTORY OF PRESENT ILLNESS: Today 01/09/18:  Sheri Perry is a 27 year old female with a history of migraine headaches.  She states that her migraines are under relatively good control with amitriptyline and gabapentin until the last 2 weeks.  She states that for the last 2 weeks she has had a daily dull headache that has been persistent.  She states that daily she develops a more severe headache and it never fully resolved.  She does report photophobia, phonophobia she reports that her headaches typically start in the occipital region and radiates to the frontal region.  She reports nausea.  She reports with her severe headache she does have blurry vision and dizziness.  In the past she has been on Topamax but was unable to tolerate this medication.  She returns today for evaluation.   UPDATE (08/21/17, VRP): Since last visit, doing well. Tolerating meds. No alleviating or aggravating factors. Mild HA continue daily, but migraines have reduced (2-3 per week). Has been able to reduce amitriptyline to 236m daily (previously 3564mdaily).  PRIOR HPI ([age]ear old female here for evaluation of pseudoseizures, headache, dizziness. [facility], [facility], intermittent loss of consciousness, difficulty walking. 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 Patient also has had intermittent migraine since age [age]ears old. Patient describes severe pain, front or back of head, with dizziness, speech difficulty, nausea, photophobia, phonophobia, hearing change. Headaches may occur on a daily basis for the past 6 months. Computer screens, noise, mayonnaise and other factors such as decreased caffeine can sometimes trigger  headaches. Menstrual cycle, PTSD and anxiety also aggravate headaches. Patient was treated with amitriptyline by previous neurologist and titrated up to 350 mg at bedtime. Patient also was tried on topiramate in the past. In the past patient was recommended to follow-up with psychiatry and psychology. She has not followed up with them in several years. On 02/28/17 patient was at a new job, training, then felt headache and passed out. No definite convulsions. No tongue biting or incontinence. Patient was evaluated in the emergency room and then discharged home. Patient also reports history of "Chiari malformation".   REVIEW OF SYSTEMS: Out of a complete 14 system review of symptoms, the patient complains only of the following symptoms, and all other reviewed systems are negative.  See HPI  ALLERGIES: Allergies  Allergen Reactions  . Bee Pollen Hives  . Hydrocodone Nausea And Vomiting    hallucinations  . LaAnette GuarneriLurasidone Hcl] Other (See Comments)    hallucinations    HOME MEDICATIONS: Outpatient Medications Prior to Visit  Medication Sig Dispense Refill  . amitriptyline (ELAVIL) 50 MG tablet Take 1-2 tablets (50-100 mg total) by mouth at bedtime. 60 tablet 12  . clobetasol ointment (TEMOVATE) 0.6.04 Apply 1 application topically 2 (two) times daily as needed (psoriasis).     . Marland Kitcheniclofenac (VOLTAREN) 50 MG EC tablet Take 1 tablet (50 mg total) by mouth 2 (two) times daily. 15 tablet 0  . gabapentin (NEURONTIN) 300 MG capsule Take 1-2 capsules (300-600 mg total) by mouth 3 (three) times daily. 180 capsule 12  . lidocaine (LIDODERM) 5 % Apply patch to painful area. Patch may remain in place for up to 12 hours in a 24 hour period.    . Marland Kitchenmeprazole (  PRILOSEC) 20 MG capsule Take 20 mg by mouth daily.    . ondansetron (ZOFRAN-ODT) 4 MG disintegrating tablet Take 1 tablet by mouth as needed.  2   No facility-administered medications prior to visit.     PAST MEDICAL HISTORY: Past Medical  History:  Diagnosis Date  . Headache    "headaches daily"  . Seizure ([place])    since 2015    PAST SURGICAL HISTORY: Past Surgical History:  Procedure Laterality Date  . FRACTURE SURGERY Right    elbow  . TONSILLECTOMY      FAMILY HISTORY: Family History  Adopted: Yes    SOCIAL HISTORY: Social History   Socioeconomic History  . Marital status: Single    Spouse name: Not on file  . Number of children: 1  . Years of education: 77  . Highest education level: Not on file  Occupational History    Comment: retail store  Social Needs  . Financial resource strain: Not on file  . Food insecurity:    Worry: Not on file    Inability: Not on file  . Transportation needs:    Medical: Not on file    Non-medical: Not on file  Tobacco Use  . Smoking status: Former Smoker    Packs/day: 2.00    Last attempt to quit: 07/21/2011    Years since quitting: 6.4  . Smokeless tobacco: Never Used  . Tobacco comment: quit 5 years ago   Substance and Sexual Activity  . Alcohol use: No  . Drug use: No  . Sexual activity: Not on file  Lifestyle  . Physical activity:    Days per week: Not on file    Minutes per session: Not on file  . Stress: Not on file  Relationships  . Social connections:    Talks on phone: Not on file    Gets together: Not on file    Attends religious service: Not on file    Active member of club or organization: Not on file    Attends meetings of clubs or organizations: Not on file    Relationship status: Not on file  . Intimate partner violence:    Fear of current or ex partner: Not on file    Emotionally abused: Not on file    Physically abused: Not on file    Forced sexual activity: Not on file  Other Topics Concern  . Not on file  Social History Narrative   Lives alone with son   Caffeine- coffee, 1-2 cups daily, soda 2 daily      PHYSICAL EXAM  Vitals:   01/09/18 1427  BP: 126/74  Pulse: (!) 113  Weight: 190 lb (86.2 kg)  Height: _0   (1.651 m)   Body mass index is 31.62 kg/m.  Generalized: Well developed, in no acute distress   Neurological examination  Mentation: Alert oriented to time, place, history taking. Follows all commands speech and language fluent Cranial nerve II-XII: Pupils were equal round reactive to light. Extraocular movements were full, visual field were full on confrontational test. Facial sensation and strength were normal. Uvula tongue midline. Head turning and shoulder shrug  were normal and symmetric. Motor: The motor testing reveals 5 over 5 strength of all 4 extremities. Good symmetric motor tone is noted throughout.  Sensory: Sensory testing is intact to soft touch on all 4 extremities. No evidence of extinction is noted.  Coordination: Cerebellar testing reveals good finger-nose-finger and heel-to-shin bilaterally.  Gait and station: Gait is  normal. Tandem gait is unsteady. Romberg is negative. No drift is seen.  Reflexes: Deep tendon reflexes are symmetric and normal bilaterally.   DIAGNOSTIC DATA (LABS, IMAGING, TESTING) - I reviewed patient records, labs, notes, testing and imaging myself where available.      Component Value Date/Time   NA 144 09/23/2009 2254   K 3.9 09/23/2009 2254   CL 108 09/23/2009 2254   CO2 25 09/23/2009 2254   GLUCOSE 86 09/23/2009 2254   BUN 5 (L) 09/23/2009 2254   CREATININE 0.7 09/23/2009 2254   CALCIUM 9.5 09/23/2009 2254   GFRNONAA >60 09/23/2009 2254   GFRAA  09/23/2009 2254    >60        The eGFR has been calculated using the MDRD equation. This calculation has not been validated in all clinical situations. eGFR's persistently <60 mL/min signify possible Chronic Kidney Disease.     ASSESSMENT AND PLAN 27 y.o. year old female  has a past medical history of Headache and Seizure ([place]). here with:  1.  Migraine headache  The patient has had a persistent dull headache for the last 2 weeks.  She has also had an increase in her migraine  headaches in the last 2 weeks.  I will give the patient a prednisone Dosepak.  She reports that she has had this before and tolerated it well.  Hopefully this will break the headache cycle.  We also discussed potentially starting Aimovig in the future if her migraine frequency continues to increase.  She is advised that if her headache worsens or she develops new symptoms she should let us know.  Patient will follow-up in 6 months or sooner if needed.      Ward Givens, MSN, NP-C 01/09/2018, 2:55 PM [facility] [address] [place], [place] [address] [phone]

## 2018-01-09 NOTE — Progress Notes (Signed)
I reviewed note and agree with plan.   Suanne MarkerVIKRAM R. Krystelle Prashad, MD 01/09/2018, 4:50 PM Certified in Neurology, Neurophysiology and Neuroimaging  [facility] [address], [address] [place], [place] [id number] [phone]

## 2018-02-06 ENCOUNTER — Telehealth: Payer: Self-pay | Admitting: Adult Health

## 2018-02-06 NOTE — Telephone Encounter (Signed)
Patient calling advising she is having daily migraines even with taking medications. Please call and discuss..Marland Kitchen

## 2018-02-07 NOTE — Telephone Encounter (Signed)
I spoke to pt.  She stated from 01-09-18 no relief of her headaches.  She has had one daily since.  Prednisone did not help.  Pain Back to front behind eyes.  Level 7 today. Has some nausea, no vomiting but is potential.  Lights and sound bothersome.  She has not had infusion here before, would have driver if ordered.  Please advise.

## 2018-02-07 NOTE — Telephone Encounter (Signed)
LMVM for pt that returning her call.  

## 2018-02-07 NOTE — Telephone Encounter (Signed)
Spoke to pt and relayed that I will check with Sheri Perry in am with intrafusion.  With time then will call her to see if still needs this.  She does not think she I pregnant.  She verbalized understanding.

## 2018-02-07 NOTE — Telephone Encounter (Signed)
Sheri Perry is gone for the day.  The patient could potentially come in tomorrow morning once we talk to [facility] for an infusion.  If she would like a Depakote infusion we need to verify that there is no chance she is pregnant.  Let me know if patient would like to proceed.

## 2018-02-08 NOTE — Telephone Encounter (Signed)
I spoke to pt and she had period one week ago, has had sex since then.  She is not on any BCP.  Per MM/NP ok to go ahead with infusion.  Pt will come in at 1200.  Pt verbalized understanding.

## 2018-02-19 ENCOUNTER — Ambulatory Visit: Payer: Medicaid Other | Admitting: Diagnostic Neuroimaging

## 2018-03-29 ENCOUNTER — Telehealth: Payer: Self-pay | Admitting: Adult Health

## 2018-03-29 MED ORDER — PREDNISONE 5 MG PO TABS: ORAL_TABLET | ORAL | 0 refills | Status: DC

## 2018-03-29 NOTE — Telephone Encounter (Signed)
Discussed with NP and called patient. Advised her the NP stated she is most likely having rebound headaches. Advised her to stop Excedrin; if taking she should not take daily and not take 4 tabs a day. Advised her to only take amitriptyline as prescribed,100 mg at bedtime. Advised her Aundra Millet will send in Rx for prednisone dose pack. Advised she will probably need to give it a few days to get good relief of her headache.  Advised her if needed, this office has a dr on call on weekends. She verbalized understanding, appreciation, agreement of plan.

## 2018-03-29 NOTE — Telephone Encounter (Signed)
Pt's had migraine that started one week ago. She has taken Excedrin, gabapentin and amitriptyline with no relief. She is wanting to be seen. Please call to advise

## 2018-03-29 NOTE — Telephone Encounter (Signed)
Spoke with patient who stated her migraine has caused nausea, and on Monday night she "blacked out" from the pain in her head. She stated Zofran is not relieving nausea. She is sensitive to lights as well. The migraine infusion she received on 02/08/18 lasted 2 weeks per patient.  She stated she is taking total of 200 mg nightly of amitriptyline, gabapentin 600 mg three times daily and Excedrin Migraine 4 tabs daily. This RN advised will discuss with NP and call her back. She verbalized understanding, appreciation.

## 2018-03-29 NOTE — Telephone Encounter (Signed)
Prednisone prescribed. Please ensure patient is not pregnant.

## 2018-03-29 NOTE — Telephone Encounter (Signed)
Called patient and informed her the prednisone prescription has been sent. Inquired about her menstrual cycle. She hasn't had one this month. She is not on birth control. She stated her period is due soon, and she feels like it is coming on. She stated she plans to get urine pregnancy test at drug store tonight. She stated if it is positive she will definitely  call this office asap. This RN advised she must call if the test is positive. She verbalized understanding.

## 2018-04-12 ENCOUNTER — Encounter: Payer: Self-pay | Admitting: Adult Health

## 2018-04-12 ENCOUNTER — Ambulatory Visit: Payer: Medicaid Other | Admitting: Adult Health

## 2018-04-12 VITALS — BP 120/85 | HR 120 | Ht 65.0 in | Wt 185.2 lb

## 2018-04-12 DIAGNOSIS — R51 Headache: Secondary | ICD-10-CM | POA: Diagnosis not present

## 2018-04-12 DIAGNOSIS — R0683 Snoring: Secondary | ICD-10-CM | POA: Diagnosis not present

## 2018-04-12 DIAGNOSIS — G43111 Migraine with aura, intractable, with status migrainosus: Secondary | ICD-10-CM | POA: Diagnosis not present

## 2018-04-12 DIAGNOSIS — R400 Somnolence: Secondary | ICD-10-CM

## 2018-04-12 DIAGNOSIS — R519 Headache, unspecified: Secondary | ICD-10-CM

## 2018-04-12 MED ORDER — ERENUMAB-AOOE 70 MG/ML ~~LOC~~ SOAJ: 70.0000 mg | SUBCUTANEOUS | 5 refills | Status: DC

## 2018-04-12 NOTE — Progress Notes (Signed)
PATIENT: Sheri Perry DOB: January 12, 1991  REASON FOR VISIT: follow up HISTORY FROM: patient  HISTORY OF PRESENT ILLNESS: Today 04/12/18:  Sheri Perry is a 27 year old female with a history of migraine headaches.  She returns today for follow-up.  She states that her migraines have increased and she has a daily headache.  She reports that her headache location varies.  She typically wakes up with a headache and has a headache for the rest of the day.  She was taking Excedrin daily but she reports that she has discontinued this.  She does report that with her headaches she typically has photophobia, phonophobia and nausea.  She does state that her fianc told her that she snores very loudly.  She also reports that she tends to sleep walk, talk and sometimes fight in her dreams.  She is currently taking amitriptyline but reports that instead of taking the 1-2 tabs as directed she is been taking 4 tablets.  She reports that she is trying to decrease her dose.  She is also on gabapentin.  She returns today for evaluation.  HISTORY Since last visit, doing well. Tolerating meds. No alleviating or aggravating factors. Mild HA continue daily, but migraines have reduced (2-3 per week). Has been able to reduce amitriptyline to 269m daily (previously 3525mdaily).  PRIOR HPI ([age]ear old female here for evaluation of pseudoseizures, headache, dizziness. [facility], [facility], intermittent loss of consciousness, difficulty walking. 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 Patient also has had intermittent migraine since age [age]ears old. Patient describes severe pain, front or back of head, with dizziness, speech difficulty, nausea, photophobia, phonophobia, hearing change. Headaches may occur on a daily basis  for the past 6 months. Computer screens, noise, mayonnaise and other factors such as decreased caffeine can sometimes trigger headaches. Menstrual cycle, PTSD and anxiety also aggravate headaches. Patient was treated with amitriptyline by previous neurologist and titrated up to 350 mg at bedtime. Patient also was tried on topiramate in the past. In the past patient was recommended to follow-up with psychiatry and psychology. She has not followed up with them in several years. On 02/28/17 patient was at a new job, training, then felt headache and passed out. No definite convulsions. No tongue biting or incontinence. Patient was evaluated in the emergency room and then discharged home. Patient also reports history of "Chiari malformation".    REVIEW OF SYSTEMS: Out of a complete 14 system review of symptoms, the patient complains only of the following symptoms, and all other reviewed systems are negative.  Activity change, appetite change, fatigue, ringing in ears, light sensitivity, double vision, blurred vision, nausea, insomnia, daytime sleepiness, snoring, sleep talking, sleepwalking, acting out dreams, memory loss, dizziness, headache, weakness, passing out, agitation, depression, nervous/anxious  ALLERGIES: Allergies  Allergen Reactions  . Citalopram Shortness Of Breath  . Bee Pollen Hives  . Lorazepam Other (See Comments)    hallucinations hallucinations   . Hydrocodone Nausea And Vomiting    hallucinations  . LaAnette GuarneriLurasidone Hcl] Other (See Comments)    hallucinations    HOME MEDICATIONS: Outpatient Medications Prior to Visit  Medication Sig Dispense Refill  . amitriptyline (ELAVIL) 50 MG tablet Take 1-2 tablets (50-100 mg total) by mouth at bedtime. 60 tablet 12  . aspirin-acetaminophen-caffeine (EXCEDRIN MIGRAINE) 25272-536-64G tablet Take by mouth every 6 (six) hours  as needed for headache.    . clobetasol ointment (TEMOVATE) 2.16 % Apply 1 application topically 2 (two) times  daily as needed (psoriasis).     Marland Kitchen diclofenac (VOLTAREN) 50 MG EC tablet Take 1 tablet (50 mg total) by mouth 2 (two) times daily. 15 tablet 0  . gabapentin (NEURONTIN) 300 MG capsule Take 1-2 capsules (300-600 mg total) by mouth 3 (three) times daily. 180 capsule 12  . lidocaine (LIDODERM) 5 % Apply patch to painful area. Patch may remain in place for up to 12 hours in a 24 hour period.    Marland Kitchen omeprazole (PRILOSEC) 20 MG capsule Take 20 mg by mouth daily.    . ondansetron (ZOFRAN-ODT) 4 MG disintegrating tablet Take 1 tablet by mouth as needed.  2  . predniSONE (DELTASONE) 5 MG tablet Begin taking 6 tablets daily, taper by one tablet daily until off the medication. 21 tablet 0   No facility-administered medications prior to visit.     PAST MEDICAL HISTORY: Past Medical History:  Diagnosis Date  . Headache    "headaches daily"  . Seizure ([place])    since 2015    PAST SURGICAL HISTORY: Past Surgical History:  Procedure Laterality Date  . FRACTURE SURGERY Right    elbow  . TONSILLECTOMY      FAMILY HISTORY: Family History  Adopted: Yes    SOCIAL HISTORY: Social History   Socioeconomic History  . Marital status: Single    Spouse name: Not on file  . Number of children: 1  . Years of education: 63  . Highest education level: Not on file  Occupational History    Comment: retail store  Social Needs  . Financial resource strain: Not on file  . Food insecurity:    Worry: Not on file    Inability: Not on file  . Transportation needs:    Medical: Not on file    Non-medical: Not on file  Tobacco Use  . Smoking status: Former Smoker    Packs/day: 2.00    Last attempt to quit: 07/21/2011    Years since quitting: 6.7  . Smokeless tobacco: Never Used  . Tobacco comment: quit 5 years ago   Substance and Sexual Activity  . Alcohol use: No  . Drug use: No  . Sexual activity: Not on file  Lifestyle  . Physical activity:    Days per week: Not on file    Minutes per session:  Not on file  . Stress: Not on file  Relationships  . Social connections:    Talks on phone: Not on file    Gets together: Not on file    Attends religious service: Not on file    Active member of club or organization: Not on file    Attends meetings of clubs or organizations: Not on file    Relationship status: Not on file  . Intimate partner violence:    Fear of current or ex partner: Not on file    Emotionally abused: Not on file    Physically abused: Not on file    Forced sexual activity: Not on file  Other Topics Concern  . Not on file  Social History Narrative   Lives alone with son   Caffeine- coffee, 1-2 cups daily, soda 2 daily      PHYSICAL EXAM  Vitals:   04/12/18 1055  Height: _0  (1.651 m)   Body mass index is 31.62 kg/m.  Generalized: Well developed, in no acute distress  Neurological examination  Mentation: Alert oriented to time, place, history taking. Follows all commands speech and language fluent Cranial nerve II-XII: Pupils were equal round reactive to light. Extraocular movements were full, visual field were full on confrontational test. Facial sensation and strength were normal. Uvula tongue midline. Head turning and shoulder shrug  were normal and symmetric. Motor: The motor testing reveals 5 over 5 strength of all 4 extremities. Good symmetric motor tone is noted throughout.  Sensory: Sensory testing is intact to soft touch on all 4 extremities. No evidence of extinction is noted.  Coordination: Cerebellar testing reveals good finger-nose-finger and heel-to-shin bilaterally.  Gait and station: Gait is normal. Tandem gait is normal. Romberg is negative. No drift is seen.  Reflexes: Deep tendon reflexes are symmetric and normal bilaterally.   DIAGNOSTIC DATA (LABS, IMAGING, TESTING) - I reviewed patient records, labs, notes, testing and imaging myself where available.  No results found for: WBC, HGB, HCT, MCV, PLT    Component Value Date/Time     NA 144 09/23/2009 2254   K 3.9 09/23/2009 2254   CL 108 09/23/2009 2254   CO2 25 09/23/2009 2254   GLUCOSE 86 09/23/2009 2254   BUN 5 (L) 09/23/2009 2254   CREATININE 0.7 09/23/2009 2254   CALCIUM 9.5 09/23/2009 2254   GFRNONAA >60 09/23/2009 2254   GFRAA  09/23/2009 2254    >60        The eGFR has been calculated using the MDRD equation. This calculation has not been validated in all clinical situations. eGFR's persistently <60 mL/min signify possible Chronic Kidney Disease.      ASSESSMENT AND PLAN 27 y.o. year old female  has a past medical history of Headache and Seizure ([place]). here with:  1. Daily headache 2. Snoring 3. Migraines 4. Daytime sleepiness  The patient is having daily headaches.  She will continue on Aimovig and gabapentin.  I advised that she should take amitriptyline as directed.  I recommended that we try Aimovig.  I reviewed the medication with the patient and went over potential side effects.  The patient and her spouse was trying to conceive however I advised that we should not start Aimovig if she is trying to conceive.  This medication is not indicated in pregnancy.  The patient states that she prefers to try the medication and hold off on pregnancy at this time.  She reports that her and her spouse will use condoms while she is on this medication.  I will also put a referral for sleep evaluation since the patient is reporting daily headaches, snoring and daytime sleepiness as well as other sleep disturbances.  The patient's heart rate is also elevated today.  it was also elevated at the last visit.  She does report a family history of cardiac issues.  She is advised to make an appointment with her primary care to discuss.  She will follow-up in 6 months or sooner as needed.   Ward Givens, MSN, NP-C 04/12/2018, 11:01 AM [facility] [address], [place], [place] [address] [phone]

## 2018-04-12 NOTE — Patient Instructions (Addendum)
Your Plan:  Continue Amitriptyline- should take as directed Continue Gabapentin  Start Aimovig Referral to sleep If your symptoms worsen or you develop new symptoms please let us know.   Thank you for coming to see [place] at [facility]. I hope we have been able to provide you high quality care today.  You may receive a patient satisfaction survey over the next few weeks. We would appreciate your feedback and comments so that we may continue to improve ourselves and the health of our patients.

## 2018-04-23 ENCOUNTER — Telehealth: Payer: Self-pay | Admitting: Adult Health

## 2018-04-23 NOTE — Telephone Encounter (Signed)
Pt requesting a call stating she was recently put on Aimovig, but over the weekend found out she is pregnant. Requesting a call to discuss medications

## 2018-04-23 NOTE — Telephone Encounter (Signed)
I called the patient.  She is now pregnant.  She estimates that she is only approximately 4 weeks.  She has called her OB/GYN but they will not do a confirmation until November 18.  I have advised the patient that there is no medication that is considered 100% safe in pregnancy.  There is a journal article published with with the Academy of neurology in the continuum series that suggests that  amitriptyline and nortriptyline are acceptable choices in pregnancy.  I explained this to the patient.  But also advised that the pregnancy category is stil a C.  As with gabapentin.  The patient would like to wean off of the medications if possible.  Gabapentin: She will reduce her dose to 1 tablet 3 times a day for 1 week.  She will then decrease to 1 tablet twice a day for 1 week.  Then 1 tablet daily for 1 week then discontinue the medication.  Amitriptyline: She is currently taking 3 tablets at bedtime.  Advised her to reduce to time for the next week.  And 1 tablet at bedtime for 1 week then discontinue the medication.  Also suggested that the patient start taking prenatal vitamins.  She can buy these over-the-counter and follow instructions on bottle.  Advised that if her headache significantly worsen she should let us know.

## 2018-05-07 NOTE — Telephone Encounter (Addendum)
Spoke with patient and advised her that per her conversation with Aundra Millet NP 2 weeks ago, she was to have tapered off Amitriptyline by now due to possible pregnancy. She has appointment 05/21/18 with her OB to determine if she actually is pregnant. She stated she has taken two pregnancy tests, and both were positive. The patient stated she was tapering down amitriptyline, went from 3 tabs to 2 tabs. She stated that when she decreased to one tab she got nauseated and couldn't sleep. She stated she only has 3 tabs left and wants to know what to take to help her sleep and for her migraines. This RN advised that the NP will most likely not continue to prescribe amitriptyline or a different prescription due to her possible pregnancy. Advised her that her nausea could be from pregnancy, not medication.  This RN stated she will let NP know, but she will probably need to talk to her OB to ask what she can take for sleep. She verbalized understanding, appreciation.

## 2018-05-07 NOTE — Telephone Encounter (Signed)
Patient calling to discuss increasing dosage of amitriptyline (ELAVIL) 50 MG tablet because she is not sleeping.

## 2018-05-10 NOTE — Telephone Encounter (Signed)
I tried to call the patient back.  I use the number that was on the profile.  It states that they are not accepting calls from certain numbers.  I will try again later.

## 2018-05-15 ENCOUNTER — Encounter: Payer: Self-pay | Admitting: *Deleted

## 2018-05-15 NOTE — Telephone Encounter (Signed)
Letter mailed to patient advising we have been unable to contact her after several attempts.

## 2018-05-15 NOTE — Telephone Encounter (Signed)
I tried to call the patient again.  Again it went straight to message from her phone carrier.  Saying that this caller has calling restrictions and therefore the call will not go through.

## 2018-06-11 ENCOUNTER — Ambulatory Visit: Payer: Medicaid Other | Admitting: Neurology

## 2018-06-11 ENCOUNTER — Encounter: Payer: Self-pay | Admitting: Neurology

## 2018-06-11 VITALS — BP 115/73 | HR 88 | Ht 65.0 in | Wt 169.0 lb

## 2018-06-11 DIAGNOSIS — R51 Headache: Secondary | ICD-10-CM | POA: Diagnosis not present

## 2018-06-11 DIAGNOSIS — Z3491 Encounter for supervision of normal pregnancy, unspecified, first trimester: Secondary | ICD-10-CM

## 2018-06-11 DIAGNOSIS — E663 Overweight: Secondary | ICD-10-CM | POA: Diagnosis not present

## 2018-06-11 DIAGNOSIS — R0683 Snoring: Secondary | ICD-10-CM

## 2018-06-11 DIAGNOSIS — R519 Headache, unspecified: Secondary | ICD-10-CM

## 2018-06-11 DIAGNOSIS — R0681 Apnea, not elsewhere classified: Secondary | ICD-10-CM | POA: Diagnosis not present

## 2018-06-11 DIAGNOSIS — G4719 Other hypersomnia: Secondary | ICD-10-CM

## 2018-06-11 NOTE — Progress Notes (Signed)
Subjective:    Patient ID: Sheri Perry is a 27 y.o. female.  HPI     Sheri Foley, MD, PhD [facility] [address] [address] [place], [place] [address]  Dear Sheri Millet,   I saw your patient, Sheri Perry, in my sleep clinic today, for initial consultation of her sleep disorder, in particular, concern for underlying obstructive sleep apnea. The patient is accompanied by her fiance today. As you know, Sheri Perry is a 27 year old right-handed woman with an underlying medical history of migraine headaches, reflux disease and overweight state, who reports snoring and excessive daytime somnolence as well as morning headaches. I reviewed your office note from 04/12/2018. Her Epworth sleepiness score is 14 out of 24, fatigue score is 55 out of 63. She has no night to night nocturia. Mom and sister have OSA. Weight has reduced since her pregnancy, almost 11 weeks currrently. She has stopped amitriptyline, she is weaning off the gabapentin. She has trouble going to sleep and takes Unisom and melatonin at night. She has been on quite a bit of caffeine and has reduced to 24 ounce serving per day on average, typically in the form of coffee and soda. She quit smoking in 2014, does not currently utilize any alcohol. She does not work, previously worked in Engineering geologist. They have to take their elderly dog out late at night, dog sleeps in the bed at the foot end. She lives with her fianc and 27-year-old son and they have 1 dog in the household.  She goes to bed late, between midnight and 2 AM typically and rise time is around 7, as she has to get up for her son who is 46 years old. She then goes back to bed and sleeps between 8:30 and noon typically. Her fianc has noted positive send her breathing while she is asleep. She has had vivid dreams and acting out in her sleep as well. She had a tonsillectomy well in high school secondary to recurrent tonsillitis.  Her Past Medical History  Is Significant For: Past Medical History:  Diagnosis Date  . Headache    "headaches daily"  . Seizure (HCC)    since 2015    Her Past Surgical History Is Significant For: Past Surgical History:  Procedure Laterality Date  . FRACTURE SURGERY Right    elbow  . TONSILLECTOMY      Her Family History Is Significant For: Family History  Adopted: Yes    Her Social History Is Significant For: Social History   Socioeconomic History  . Marital status: Single    Spouse name: Not on file  . Number of children: 1  . Years of education: 12  . Highest education level: Not on file  Occupational History    Comment: retail store  Social Needs  . Financial resource strain: Not on file  . Food insecurity:    Worry: Not on file    Inability: Not on file  . Transportation needs:    Medical: Not on file    Non-medical: Not on file  Tobacco Use  . Smoking status: Former Smoker    Packs/day: 2.00    Last attempt to quit: 07/21/2011    Years since quitting: 6.8  . Smokeless tobacco: Never Used  . Tobacco comment: quit 5 years ago   Substance and Sexual Activity  . Alcohol use: No  . Drug use: No  . Sexual activity: Not on file  Lifestyle  . Physical activity:  Days per week: Not on file    Minutes per session: Not on file  . Stress: Not on file  Relationships  . Social connections:    Talks on phone: Not on file    Gets together: Not on file    Attends religious service: Not on file    Active member of club or organization: Not on file    Attends meetings of clubs or organizations: Not on file    Relationship status: Not on file  Other Topics Concern  . Not on file  Social History Narrative   Lives alone with son   Caffeine- coffee, 1-2 cups daily, soda 2 daily    Her Allergies Are:  Allergies  Allergen Reactions  . Citalopram Shortness Of Breath  . Bee Pollen Hives  . Lorazepam Other (See Comments)    hallucinations hallucinations   . Hydrocodone Nausea And  Vomiting    hallucinations  . Latuda [Lurasidone Hcl] Other (See Comments)    hallucinations  :   Her Current Medications Are:  Outpatient Encounter Medications as of 06/11/2018  Medication Sig  . clobetasol ointment (TEMOVATE) 0.05 % Apply 1 application topically 2 (two) times daily as needed (psoriasis).   . gabapentin (NEURONTIN) 300 MG capsule Take 1-2 capsules (300-600 mg total) by mouth 3 (three) times daily.  . diclofenac (VOLTAREN) 50 MG EC tablet Take 1 tablet (50 mg total) by mouth 2 (two) times daily.  . [DISCONTINUED] amitriptyline (ELAVIL) 50 MG tablet Take 1-2 tablets (50-100 mg total) by mouth at bedtime.  . [DISCONTINUED] Erenumab-aooe (AIMOVIG) 70 MG/ML SOAJ Inject 70 mg into the skin every 30 (thirty) days.  . [DISCONTINUED] omeprazole (PRILOSEC) 20 MG capsule Take 20 mg by mouth daily.  . [DISCONTINUED] ondansetron (ZOFRAN-ODT) 4 MG disintegrating tablet Take 1 tablet by mouth as needed.   No facility-administered encounter medications on file as of 06/11/2018.   :  Review of Systems:  Out of a complete 14 point review of systems, all are reviewed and negative with the exception of these symptoms as listed below:  Review of Systems  Neurological:       Pt presents today to discuss her sleep. Pt has never had a sleep study but does endorse snoring.  Epworth Sleepiness Scale 0= would never doze 1= slight chance of dozing 2= moderate chance of dozing 3= high chance of dozing  Sitting and reading: 3 Watching TV: 2 Sitting inactive in a public place (ex. Theater or meeting): 3 As a passenger in a car for an hour without a break: 3 Lying down to rest in the afternoon: 2 Sitting and talking to someone: 0 Sitting quietly after lunch (no alcohol): 1 In a car, while stopped in traffic: 0 Total: 14     Objective:  Neurological Exam  Physical Exam Physical Examination:   Vitals:   06/11/18 1037  BP: 115/73  Pulse: 88    General Examination: The patient is  a very pleasant 27 y.o. female in no acute distress. She appears well-developed and well-nourished and well groomed.   HEENT: Normocephalic, atraumatic, pupils are equal, round and reactive to light and accommodation. Extraocular tracking is good without limitation to gaze excursion or nystagmus noted. Normal smooth pursuit is noted. Hearing is grossly intact. Face is symmetric with normal facial animation and normal facial sensation. Speech is clear with no dysarthria noted. There is no hypophonia. There is no lip, neck/head, jaw or voice tremor. Neck is supple with full range of passive and  active motion. There are no carotid bruits on auscultation. Oropharynx exam reveals: mild mouth dryness, adequate dental hygiene and no significant airway crowding, tonsils are absent, Mallampati is class I, tongue protrudes centrally and palate elevates symmetrically, neck circumference is 14 inches. She has no obvious overbite.  Chest: Clear to auscultation without wheezing, rhonchi or crackles noted.  Heart: S1+S2+0, regular and normal without murmurs, rubs or gallops noted.   Abdomen: Soft, non-tender and non-distended with normal bowel sounds appreciated on auscultation.  Extremities: There is no pitting edema in the distal lower extremities bilaterally.   Skin: Warm and dry without trophic changes noted.  Musculoskeletal: exam reveals no obvious joint deformities, tenderness or joint swelling or erythema.   Neurologically:  Mental status: The patient is awake, alert and oriented in all 4 spheres. Her immediate and remote memory, attention, language skills and fund of knowledge are appropriate. There is no evidence of aphasia, agnosia, apraxia or anomia. Speech is clear with normal prosody and enunciation. Thought process is linear. Mood is normal and affect is normal.  Cranial nerves II - XII are as described above under HEENT exam. In addition: shoulder shrug is normal with equal shoulder height  noted. Motor exam: Normal bulk, strength and tone is noted. There is no tremor. Romberg is negative. Fine motor skills and coordination: grossly intact.  Cerebellar testing: No dysmetria or intention tremor. There is no truncal or gait ataxia.  Sensory exam: intact to light touch in the upper and lower extremities.  Gait, station and balance: She stands easily. No veering to one side is noted. No leaning to one side is noted. Posture is age-appropriate and stance is narrow based. Gait shows normal stride length and normal pace. No problems turning are noted. Tandem walk is somewhat difficult for her.              Assessment and Plan:  In summary, Sheri Perry is a very pleasant 27 y.o.-year old female with an underlying medical history of migraine headaches, reflux disease and overweight state, whose history and physical exam are concerning for obstructive sleep apnea (OSA). I had a long chat with the patient and her fiance about my findings and the diagnosis of OSA, its prognosis and treatment options. We talked about medical treatments, surgical interventions and non-pharmacological approaches. I explained in particular the risks and ramifications of untreated moderate to severe OSA, especially with respect to developing cardiovascular disease down the Road, including congestive heart failure, difficult to treat hypertension, cardiac arrhythmias, or stroke. Even type 2 diabetes has, in part, been linked to untreated OSA. Symptoms of untreated OSA include daytime sleepiness, memory problems, mood irritability and mood disorder such as depression and anxiety, lack of energy, as well as recurrent headaches, especially morning headaches. We talked about OSA trying to maintain a healthy lifestyle in general, as well as the importance of weight control. I encouraged the patient to eat healthy, exercise daily and keep well hydrated, to keep a scheduled bedtime and wake time routine, to not skip any meals and  eat healthy snacks in between meals. I advised the patient not to drive when feeling sleepy. I recommended the following at this time: sleep study with potential positive airway pressure titration. (We will score hypopneas at 4% and split the sleep study into diagnostic and treatment portion, if the estimated. 2 hour AHI is >20/h).   I explained the sleep test procedure to the patient and also outlined possible surgical and non-surgical treatment options of OSA, including  the use of a custom-made dental device (which would require a referral to a specialist dentist or oral surgeon), upper airway surgical options, such as pillar implants, radiofrequency surgery, tongue base surgery, and UPPP (which would involve a referral to an ENT surgeon). Rarely, jaw surgery such as mandibular advancement may be considered.  I also explained the CPAP treatment option to the patient, who indicated that she would be willing to try CPAP if the need arises. I explained the importance of being compliant with PAP treatment, not only for insurance purposes but primarily to improve Her symptoms, and for the patient's long term health benefit, including to reduce Her cardiovascular risks. I answered all their questions today and the patient and her fiance were in agreement. I plan to see her back after the sleep study is completed and encouraged her to call with any interim questions, concerns, problems or updates.   Thank you very much for allowing me to participate in the care of this nice patient. If I can be of any further assistance to you please do not hesitate to call me at [phone].  Sincerely,   Sheri FoleySaima Tamella Tuccillo, MD, PhD

## 2018-06-11 NOTE — Patient Instructions (Signed)

## 2018-07-15 ENCOUNTER — Ambulatory Visit (INDEPENDENT_AMBULATORY_CARE_PROVIDER_SITE_OTHER): Payer: Medicaid Other | Admitting: Neurology

## 2018-07-15 DIAGNOSIS — G4710 Hypersomnia, unspecified: Secondary | ICD-10-CM

## 2018-07-15 DIAGNOSIS — G4719 Other hypersomnia: Secondary | ICD-10-CM

## 2018-07-15 DIAGNOSIS — R0681 Apnea, not elsewhere classified: Secondary | ICD-10-CM

## 2018-07-15 DIAGNOSIS — R519 Headache, unspecified: Secondary | ICD-10-CM

## 2018-07-15 DIAGNOSIS — R0683 Snoring: Secondary | ICD-10-CM

## 2018-07-15 DIAGNOSIS — Z3491 Encounter for supervision of normal pregnancy, unspecified, first trimester: Secondary | ICD-10-CM

## 2018-07-15 DIAGNOSIS — R51 Headache: Secondary | ICD-10-CM

## 2018-07-15 DIAGNOSIS — E663 Overweight: Secondary | ICD-10-CM

## 2018-07-18 ENCOUNTER — Telehealth: Payer: Self-pay

## 2018-07-18 NOTE — Procedures (Signed)
PATIENT'S NAME:  Sheri Perry, [name] DOB:      1991-02-09      MR#:    [id number]     DATE OF RECORDING: 07/15/2018 REFERRING M.D.:  Butch PennyMegan Millikan, NP Study Performed:   Baseline Polysomnogram HISTORY: 28 year old woman with a history of migraine headaches, reflux disease and overweight state, who reports snoring and excessive daytime somnolence as well as morning headaches. Her Epworth sleepiness score is 14 out of 24. The patient's weight 169 pounds with a height of 65 (inches), resulting in a BMI of 28.3 kg/m2. The patient's neck circumference measured 14 inches.  CURRENT MEDICATIONS: Temovate, Neurontin, Voltaren.   PROCEDURE:  This is a multichannel digital polysomnogram utilizing the Somnostar 11.2 system.  Electrodes and sensors were applied and monitored per AASM Specifications.   EEG, EOG, Chin and Limb EMG, were sampled at 200 Hz.  ECG, Snore and Nasal Pressure, Thermal Airflow, Respiratory Effort, CPAP Flow and Pressure, Oximetry was sampled at 50 Hz. Digital video and audio were recorded.      BASELINE STUDY  Lights Out was at 20:49 and Lights On at 05:00.  Total recording time (TRT) was 491.5 minutes, with a total sleep time (TST) of 428 minutes.   The patient's sleep latency was 52.5 minutes, which is delayed. REM latency was 82 minutes, which is normal. The sleep efficiency was 87.1 %.     SLEEP ARCHITECTURE: WASO (Wake after sleep onset) was 25.5 minutes with very mild sleep fragmentation noted. There were 5.5 minutes in Stage N1, 251.5 minutes Stage N2, 81.5 minutes Stage N3 and 89.5 minutes in Stage REM. The percentage of Stage N1 was 1.3%, Stage N2 was 58.8%, which is slightly increased, Stage N3 was 19.%, which is normal, and Stage R (REM sleep) was 20.9%, which is normal. The arousals were noted as: 26 were spontaneous, 4 were associated with PLMs, 0 were associated with respiratory events.  RESPIRATORY ANALYSIS:  There were a total of 7 respiratory events:  0 obstructive apneas, 4  central apneas and 0 mixed apneas with a total of 4 apneas and an apnea index (AI) of .6 /hour. There were 3 hypopneas with a hypopnea index of .4 /hour. The patient also had 0 respiratory event related arousals (RERAs).      The total APNEA/HYPOPNEA INDEX (AHI) was 1. /hour and the total RESPIRATORY DISTURBANCE INDEX was 1. /hour.  1 events occurred in REM sleep and 9 events in NREM. The REM AHI was .7 /hour, versus a non-REM AHI of 1.1. The patient spent 105.5 minutes of total sleep time in the supine position and 323 minutes in non-supine.. The supine AHI was 1.7 versus a non-supine AHI of 0.8.  OXYGEN SATURATION & C02:  The Wake baseline 02 saturation was 97%, with the lowest being 91%. Time spent below 89% saturation equaled 0 minutes.  PERIODIC LIMB MOVEMENTS:  The patient had a total of 35 Periodic Limb Movements.  The Periodic Limb Movement (PLM) index was 4.9 and the PLM Arousal index was .6/hour.  Audio and video analysis did not show any abnormal or unusual movements, behaviors, phonations or vocalizations. The patient took bathroom breaks. Very mild and intermittent snoring was noted. The EKG was in keeping with normal sinus rhythm (NSR).  Post-study, the patient indicated that sleep was the same as usual.   IMPRESSION:  1. Primary Snoring  RECOMMENDATIONS:  1. This study does not demonstrate any significant obstructive or central sleep disordered breathing. Very mild and intermittent snoring was noted. This  study does not support an intrinsic sleep disorder as a cause of the patient's symptoms. Other causes, including circadian rhythm disturbances, an underlying mood disorder, medication effect and/or an underlying medical problem cannot be ruled out. 2. This study shows a fairly good sleep architecture and near-normal sleep stage percentages. There is no evidence of an intrinsic sleep disorder or an obvious cause for the patient's sleep-related symptoms. Causes include (but are not  limited to) the first night effect of the sleep study, circadian rhythm disturbances, medication effect or an underlying mood disorder or medical problem.  3. The patient should be cautioned not to drive, work at heights, or operate dangerous or heavy equipment when tired or sleepy. Review and reiteration of good sleep hygiene measures should be pursued with any patient. 4. The patient will be advised to follow up with the referring provider, who will be notified of the test results.  I certify that I have reviewed the entire raw data recording prior to the issuance of this report in accordance with the Standards of Accreditation of the American Academy of Sleep Medicine (AASM)   Huston Foley, MD, PhD Diplomat, American Board of Neurology and Sleep Medicine (Neurology and Sleep Medicine)

## 2018-07-18 NOTE — Telephone Encounter (Signed)
I called pt. I advised pt that Dr. Frances FurbishAthar reviewed pt's sleep study and found that pt showed normal findings with some intermittent soft snoring, but achieved all stages of her sleep. Dr. Frances FurbishAthar recommends that pt follow up with MM or Dr. Marjory LiesPenumalli. I reviewed sleep hygiene recommendations with the pt, including trying to keep a regular sleep wake schedule, avoiding electronics in the bedroom, keeping the bedroom cool, dark, and quiet, and avoiding eating or exercising within 2 hours of bedtime as well as eating in the middle of the night. I advised pt to keep pets out of the bedroom. I discussed with pt the importance of stress relief and to try meditation, deep breathing exercises, and/or a white noise machine or fan to diffuse other noise distractors. I advised pt to not drink alcohol before bedtime and to never mix alcohol and sedating medications. Pt was advised to avoid narcotic pain medication close to bedtime. I advised pt that a copy of these sleep study results will be sent to Dr. Marjory LiesPenumalli and Aundra MilletMegan, NP. Pt verbalized understanding of results. Pt had no questions at this time but was encouraged to call back if questions arise.

## 2018-07-18 NOTE — Telephone Encounter (Signed)
-----   Message from Huston Foley, MD sent at 07/18/2018  8:33 AM EST ----- Patient referred by MM, seen by me on 06/11/18, diagnostic PSG on 07/15/18.   Please call and notify the patient that the recent sleep study showed normal findings, some intermittent/soft snoring. Sleep fairly well, with some delay in sleep onset, and achieved all stages of sleep with a fairly healthy/physiologic pattern.  She can FU with MM or Dr. Marjory Lies, as scheduled.  Please remind patient to try to maintain good sleep hygiene, which means: Keep a regular sleep and wake schedule and make enough time for sleep (7 1/2 to 8 1/2 hours for the average adult), try not to exercise or have a meal within 2 hours of your bedtime, try to keep your bedroom conducive for sleep, that is, cool and dark, without light distractors such as an illuminated alarm clock, and refrain from watching TV right before sleep or in the middle of the night and do not keep the TV or radio on during the night. If a nightlight is used, have it away from the visual field. Also, try not to use or play on electronic devices at bedtime, such as your cell phone, tablet PC or laptop. If you like to read at bedtime on an electronic device, try to dim the background light as much as possible. Do not eat in the middle of the night. Keep pets away from the bedroom environment. For stress relief, try meditation, deep breathing exercises (there are many books and CDs available), a white noise machine or fan can help to diffuse other noise distractors, such as traffic noise. Do not drink alcohol before bedtime, as it can disturb sleep and cause middle of the night awakenings. Never mix alcohol and sedating medications! Avoid narcotic pain medication close to bedtime, as opioids/narcotics can suppress breathing drive and breathing effort.    Huston Foley, MD, PhD [facility])

## 2018-07-18 NOTE — Progress Notes (Signed)
Patient referred by MM, seen by me on 06/11/18, diagnostic PSG on 07/15/18.   Please call and notify the patient that the recent sleep study showed normal findings, some intermittent/soft snoring. Sleep fairly well, with some delay in sleep onset, and achieved all stages of sleep with a fairly healthy/physiologic pattern.  She can FU with MM or Dr. Marjory Lies, as scheduled.  Please remind patient to try to maintain good sleep hygiene, which means: Keep a regular sleep and wake schedule and make enough time for sleep (7 1/2 to 8 1/2 hours for the average adult), try not to exercise or have a meal within 2 hours of your bedtime, try to keep your bedroom conducive for sleep, that is, cool and dark, without light distractors such as an illuminated alarm clock, and refrain from watching TV right before sleep or in the middle of the night and do not keep the TV or radio on during the night. If a nightlight is used, have it away from the visual field. Also, try not to use or play on electronic devices at bedtime, such as your cell phone, tablet PC or laptop. If you like to read at bedtime on an electronic device, try to dim the background light as much as possible. Do not eat in the middle of the night. Keep pets away from the bedroom environment. For stress relief, try meditation, deep breathing exercises (there are many books and CDs available), a white noise machine or fan can help to diffuse other noise distractors, such as traffic noise. Do not drink alcohol before bedtime, as it can disturb sleep and cause middle of the night awakenings. Never mix alcohol and sedating medications! Avoid narcotic pain medication close to bedtime, as opioids/narcotics can suppress breathing drive and breathing effort.    Huston Foley, MD, PhD [facility])

## 2018-07-31 ENCOUNTER — Ambulatory Visit: Payer: Medicaid Other | Admitting: Adult Health

## 2018-10-17 ENCOUNTER — Telehealth: Payer: Self-pay

## 2018-10-17 NOTE — Telephone Encounter (Signed)
Pt is on MM's waitlist. I called to offer her a sooner appt, perhaps with Amy, NP (virutally). No answer, left a message asking her to call me back.

## 2018-11-20 ENCOUNTER — Telehealth: Payer: Self-pay

## 2018-11-20 NOTE — Telephone Encounter (Signed)
Spoke with the patient and they have given verbal consent to file their insurance and to do a [email or web address] visit. E-mail, mobile number and carrier have been confirmed and sent.  E-mail: [email or web address] Text: [phone] Development worker, international aid)

## 2018-11-28 ENCOUNTER — Encounter

## 2018-11-28 ENCOUNTER — Ambulatory Visit (INDEPENDENT_AMBULATORY_CARE_PROVIDER_SITE_OTHER): Payer: Medicaid Other | Admitting: Adult Health

## 2018-11-28 ENCOUNTER — Other Ambulatory Visit: Payer: Self-pay

## 2018-11-28 DIAGNOSIS — G43719 Chronic migraine without aura, intractable, without status migrainosus: Secondary | ICD-10-CM

## 2018-11-28 NOTE — Progress Notes (Signed)
[facility] [address] [place]. [place] [address] [phone]     Virtual Visit via Telephone Note  I connected with Sheri Perry on 11/28/18 at  8:30 AM EDT by telephone located remotely at [facility] and verified that I am speaking with the correct person using two identifiers who reports being located at home.    I discussed the limitations, risks, security and privacy concerns of performing an evaluation and management service by telephone and the availability of in person appointments. I also discussed with the patient that there may be a patient responsible charge related to this service. The patient expressed understanding and agreed to proceed.  The patient was originally scheduled for a virtual visit however hercamera and microphone on her computer would not work so we converted to a telephone visit.   History of Present Illness:  Sheri Perry is a 28 y.o. female who has been followed in this office for migraines.  She was initially scheduled for face-to-face office follow up visit today but due to COVID19, visit rescheduled for non-face-to-face telephone visit with patients consent. Unable to participate in video visit due to lack of access to device with camera.   Today 11/28/18 Sheri Perry is a 28 year old female with a history of migraine headaches.  She is currently [redacted] weeks pregnant.  She states that she has a daily headache.  Her OB/GYN prescribed Fioricet and Zofran.  She states is a Fioricet does not offer any benefit.  She states that she gets  headaches typically in the occipital region.  She does have photophobia and phonophobia as well as nausea.  Right now the headaches are manageable.     HISTORY: 04/12/18:  Sheri Perry is a 28 year old female with a history of migraine headaches.  She returns today for follow-up.  She states that her migraines have increased and she has a daily headache.  She reports that her headache  location varies.  She typically wakes up with a headache and has a headache for the rest of the day.  She was taking Excedrin daily but she reports that she has discontinued this.  She does report that with her headaches she typically has photophobia, phonophobia and nausea.  She does state that her fianc told her that she snores very loudly.  She also reports that she tends to sleep walk, talk and sometimes fight in her dreams.  She is currently taking amitriptyline but reports that instead of taking the 1-2 tabs as directed she is been taking 4 tablets.  She reports that she is trying to decrease her dose.  She is also on gabapentin.  She returns today for evaluation. Observations/Objective:   Neurological examination  Mentation: Alert oriented to time, place, history taking. speech and language fluent  Assessment and Plan:  1.  Migraine headaches  The patient is having daily headaches however she feels that they are manageable at this time.  We discussed that if she begins having severe headaches we can consider trigger point injection with lidocaine.  These are considered safe in pregnancy.  Patient voiced understanding.  She will call if her headaches worsen.  She will follow-up in approximately 4 months.  Follow Up Instructions:    F/U in 4 months   I discussed the assessment and treatment plan with the patient.  The patient was provided an opportunity to ask questions and all were answered to their satisfaction. The patient agreed with the plan and verbalized an understanding of the instructions.  I provided 12  minutes of non-face-to-face time during this encounter.    Butch PennyMegan Zafar Debrosse , NP  [facility] [address] [place], [place] [id number]  Phone [phone] Fax [phone]

## 2018-11-29 NOTE — Progress Notes (Signed)
I reviewed note and agree with plan.   Jermy Couper R. Karene Bracken, MD 11/29/2018, 6:26 PM Certified in Neurology, Neurophysiology and Neuroimaging  [facility] [address], [address] [place], [place] [address] [phone]  

## 2018-12-11 ENCOUNTER — Telehealth: Payer: Self-pay | Admitting: Adult Health

## 2018-12-11 IMAGING — DX DG ANKLE COMPLETE 3+V*R*
3 series · 3 of 3 positions shown · non-contrast
Comparison: None.

CLINICAL DATA: Subtle [REDACTED], twisted ankle

EXAM:
RIGHT ANKLE - COMPLETE 3+ VIEW

[ankle ap]
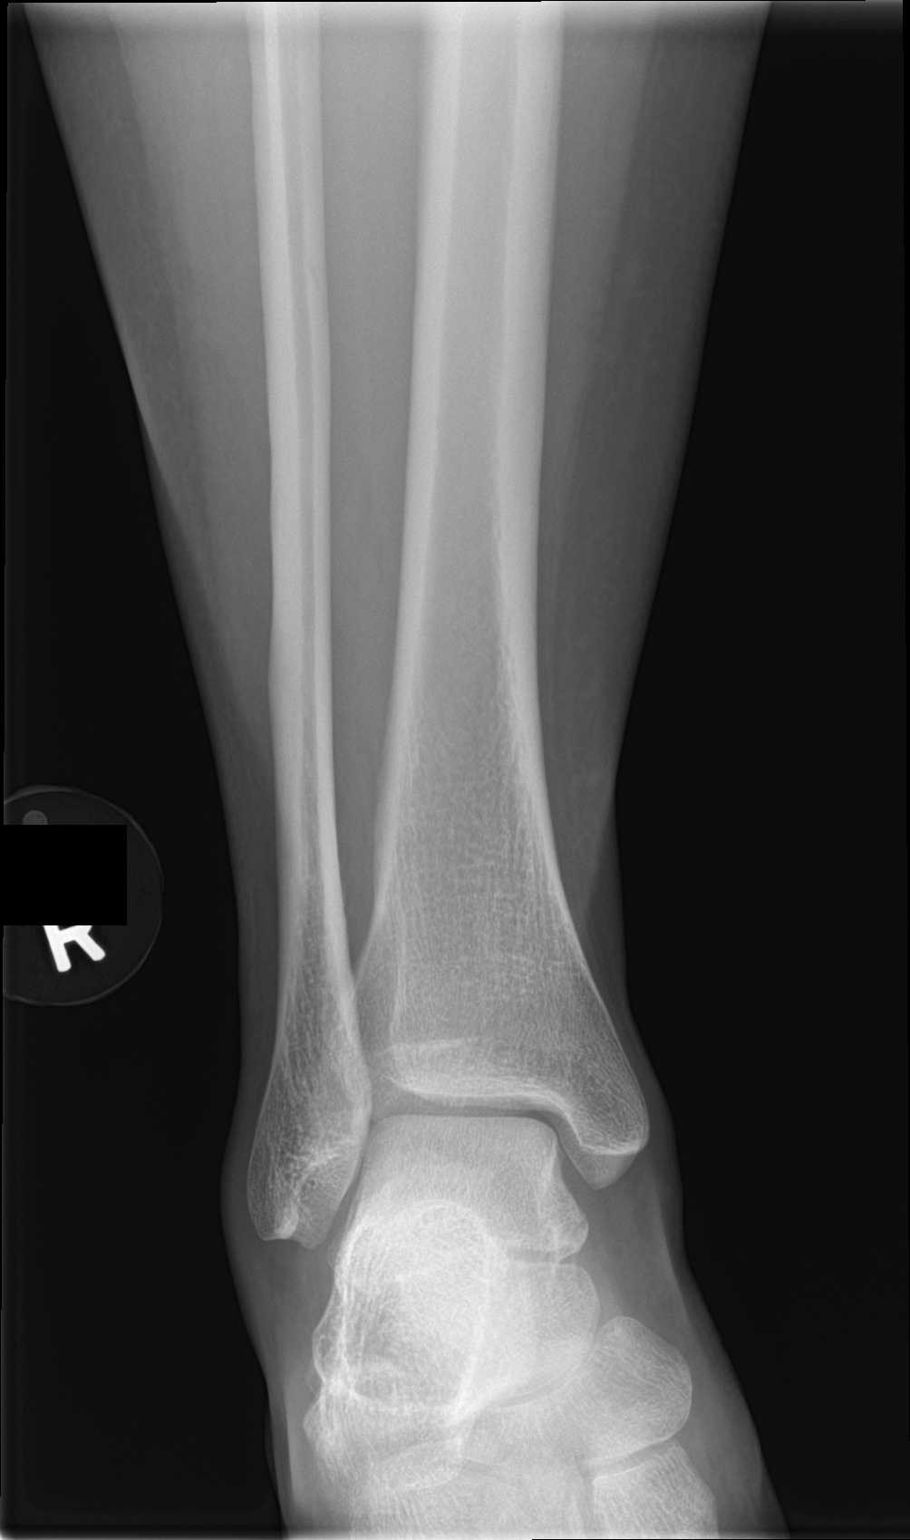

[ankle obl]
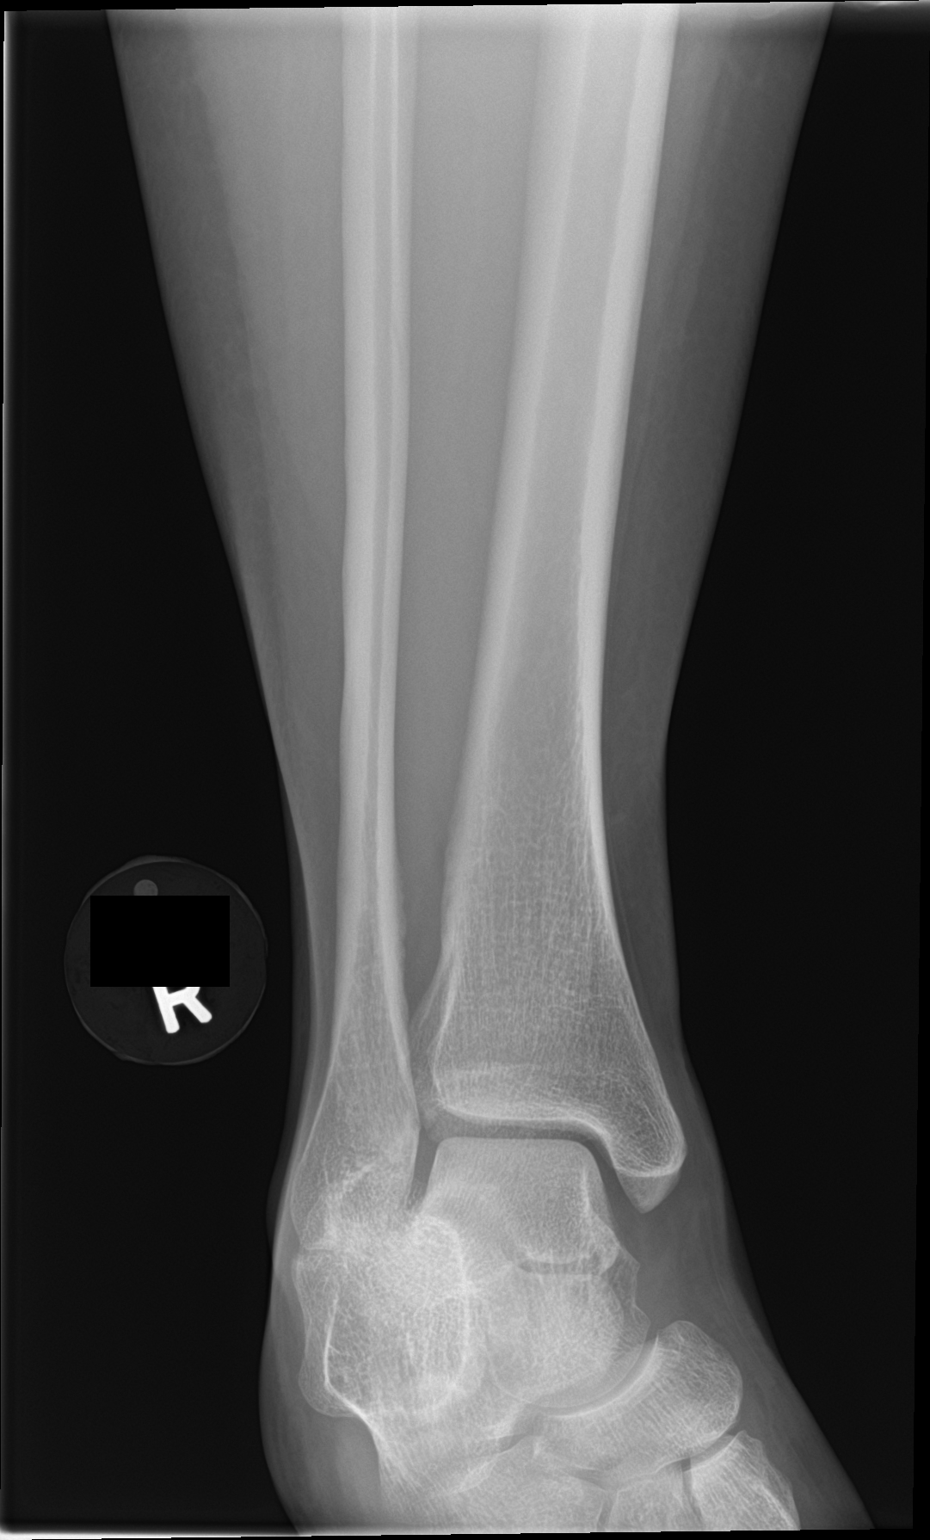

[ankle lat]
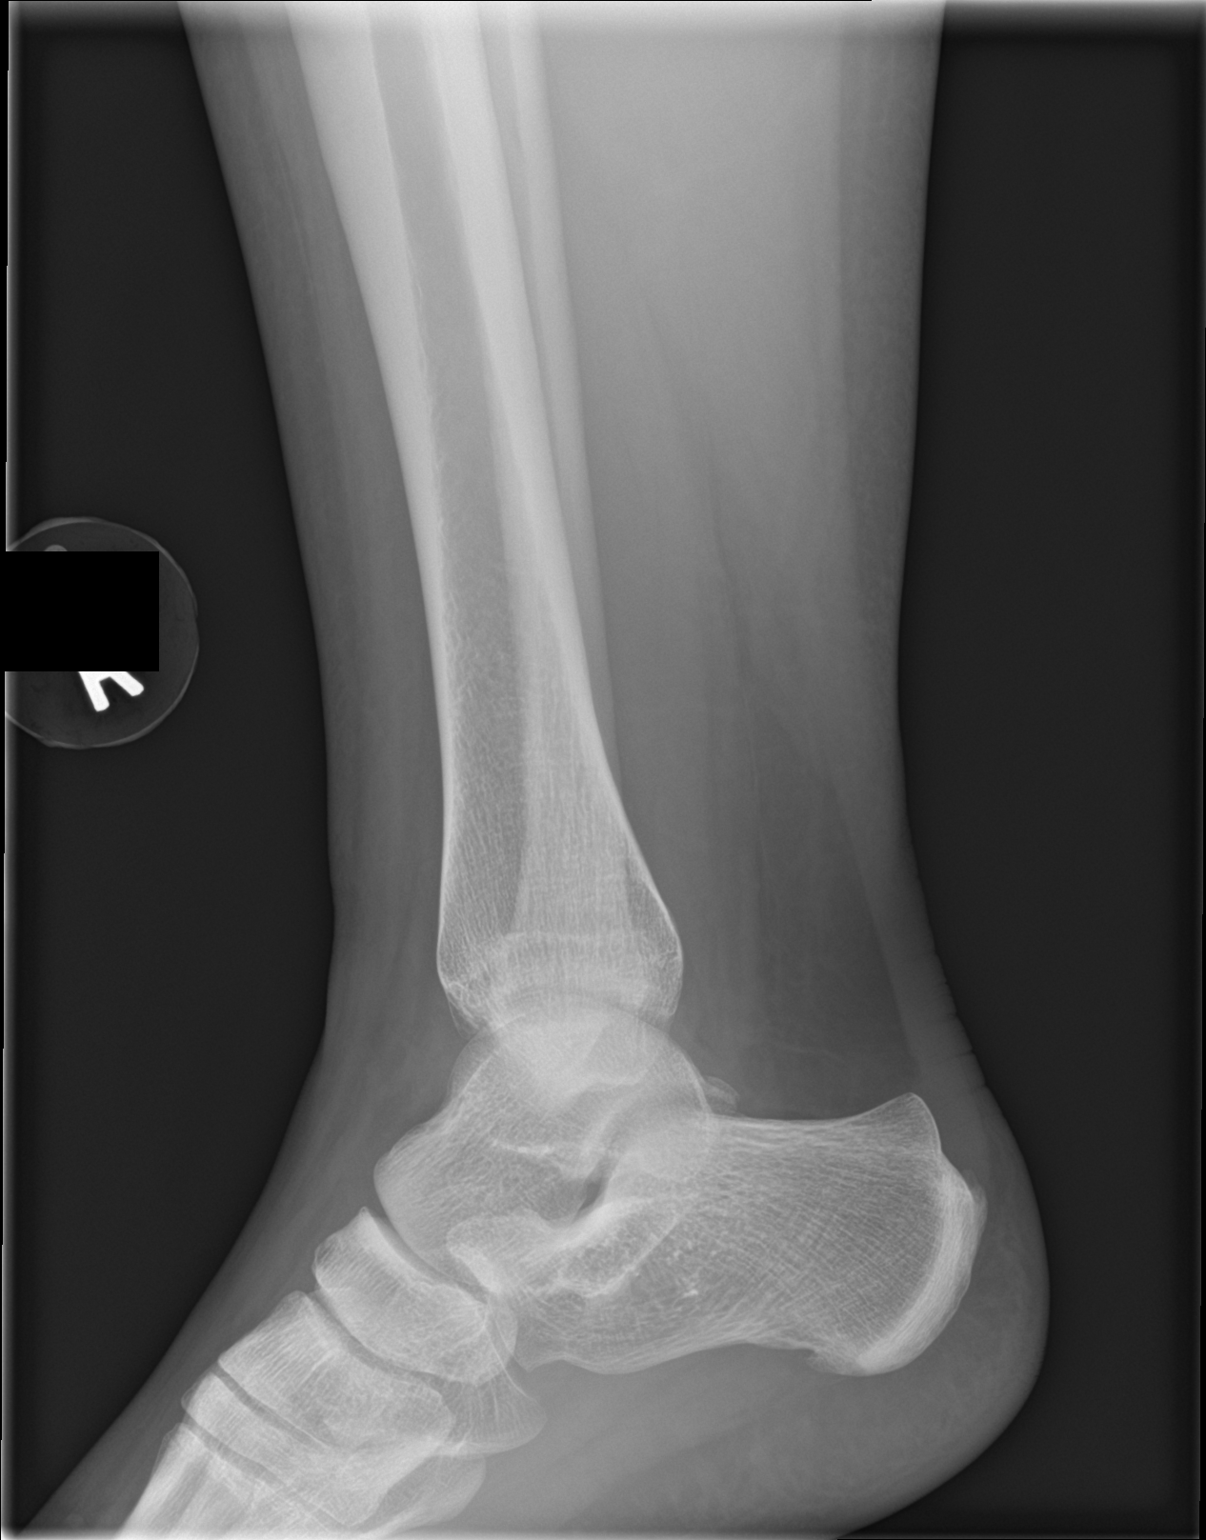

[3 of 3 positions shown; findings below may reference images not displayed]

FINDINGS: Ankle mortise intact. The talar dome is normal. No malleolar
fracture. The calcaneus is normal.
IMPRESSION: No fracture or dislocation.

## 2018-12-11 NOTE — Telephone Encounter (Signed)
Spoke to pt and she would like to come in for trigger point.  She is pregnant and due 6/30-20 but her headaches are worsening, is having then daily.  Made appt 1300 ib 12-13-18.  She is made aware of the process, due to covid 19 (wearing mask , drive up (screening questions, temperature).  Only pt to come in.  She verbalized understanding.

## 2018-12-11 NOTE — Telephone Encounter (Signed)
Pt is asking for a call to discuss scheduling a trigger point injection

## 2018-12-11 NOTE — Telephone Encounter (Signed)
No preference

## 2018-12-13 ENCOUNTER — Encounter: Payer: Self-pay | Admitting: Adult Health

## 2018-12-13 ENCOUNTER — Other Ambulatory Visit: Payer: Self-pay

## 2018-12-13 ENCOUNTER — Ambulatory Visit (INDEPENDENT_AMBULATORY_CARE_PROVIDER_SITE_OTHER): Payer: Medicaid Other | Admitting: Adult Health

## 2018-12-13 VITALS — BP 122/77 | HR 87 | Temp 97.7°F | Wt 177.8 lb

## 2018-12-13 DIAGNOSIS — G43719 Chronic migraine without aura, intractable, without status migrainosus: Secondary | ICD-10-CM

## 2018-12-13 NOTE — Progress Notes (Signed)
I reviewed note and agree with plan.   Saray Capasso R. Ashara Lounsbury, MD 12/13/2018, 7:03 PM Certified in Neurology, Neurophysiology and Neuroimaging  [facility] [address], [address] , [place] [address] [phone]  

## 2018-12-13 NOTE — Progress Notes (Signed)
     History:   Today 12/13/18 Ms. Lords is a 28 year old female with a history of migraine headaches.  She has had an intractable headache for several days.  She comes in today for trigger point injections. Patient reports that she has had trigger point injections in the past with good benefit. Headache is located in the bilateral occipital region.   11/28/18 Ms. Bobst is a 28 year old female with a history of migraine headaches.  She is currently [redacted] weeks pregnant.  She states that she has a daily headache.  Her OB/GYN prescribed Fioricet and Zofran.  She states is a Fioricet does not offer any benefit.  She states that she gets  headaches typically in the occipital region.  She does have photophobia and phonophobia as well as nausea.  Right now the headaches are manageable.   HISTORY: 04/12/18:  Ms. Tippy a 28 year old female with a history of migraine headaches. She returns today for follow-up. She states that her migraines have increased and she has a daily headache. She reports that her headache location varies. She typically wakes up with a headache and has a headache for the rest of the day. She was taking Excedrin daily but she reports that she has discontinued this. She does report that with her headaches she typically has photophobia, phonophobia and nausea. She does state that herfianc told her that she snores very loudly. She also reports that she tends to sleep walk,talk and sometimes fight in her dreams. She is currently taking amitriptyline but reports that instead of taking the 1-2 tabs as directed she is been taking 4 tablets. She reports that she is trying to decrease her dose. She is also on gabapentin. She returns today for evaluation.   Bupivicaine injection/Lidocaine protocol for occipital neuralgia  Bupivacaine 0.5% and Lidocaine 2% 1:1 mixture was injected on the scalp  at several locations:  -On the occipital area of the head, 3 injections, 1  cc per injection at the midpoint between the mastoid process and the occipital protuberance. 2 other injections were done one finger breadth from the initial injection, one at a 10 o'clock position and the other at a 2 o'clock position.  The patient tolerated the injections well, no complications of the procedure were noted. Injections were made with a 27-gauge needle.  Bupivacaine 0.5% 3cc NDC [id number]. LOT [id number]  Exp 07/2019.  LIDOCAINE 2%  [place] [id number] LOT [id number]  EXP 01-02-2020  3cc.      Ward Givens, MSN, NP-C 12/13/2018, 2:58 PM [facility] [address] [place], [place] [address] [phone]

## 2018-12-13 NOTE — Progress Notes (Signed)
Bupivacaine 0.5% 3cc NDC [id number]. LOT [id number]  Exp 07/2019.  LIDOCAINE 2%  [place] [id number] LOT [id number]  EXP 01-02-2020  3cc.

## 2018-12-19 ENCOUNTER — Ambulatory Visit: Payer: Medicaid Other | Admitting: Adult Health

## 2019-05-09 ENCOUNTER — Encounter: Payer: Self-pay | Admitting: Adult Health

## 2019-05-09 ENCOUNTER — Other Ambulatory Visit: Payer: Self-pay

## 2019-05-09 ENCOUNTER — Ambulatory Visit: Payer: Medicaid Other | Admitting: Adult Health

## 2019-05-09 VITALS — BP 125/84 | HR 82 | Temp 97.8°F | Ht 65.0 in | Wt 159.2 lb

## 2019-05-09 DIAGNOSIS — F53 Postpartum depression: Secondary | ICD-10-CM | POA: Diagnosis not present

## 2019-05-09 DIAGNOSIS — O99345 Other mental disorders complicating the puerperium: Secondary | ICD-10-CM | POA: Diagnosis not present

## 2019-05-09 DIAGNOSIS — G43011 Migraine without aura, intractable, with status migrainosus: Secondary | ICD-10-CM | POA: Diagnosis not present

## 2019-05-09 NOTE — Patient Instructions (Signed)
Your Plan:  Call and let us know dose of Gabapentin Depacon infusion today We will call OB to make appointment If your symptoms worsen or you develop new symptoms please let us know.   Thank you for coming to see [place] at [facility]. I hope we have been able to provide you high quality care today.  You may receive a patient satisfaction survey over the next few weeks. We would appreciate your feedback and comments so that we may continue to improve ourselves and the health of our patients.

## 2019-05-09 NOTE — Progress Notes (Signed)
PATIENT: Sheri Perry DOB: 02/07/1991  REASON FOR VISIT: follow up HISTORY FROM: patient  HISTORY OF PRESENT ILLNESS: Today 05/09/19:  Sheri Perry is a 28 year old female with a history of migraine headaches.  She returns today for follow-up.  She recently had a baby in June.  She states that she feels as if she is suffering from postpartum depression.  She has not followed with her OB/GYN for this.  She states that most days she feels miserable.  States that she does not feel like getting ready for the day.  She states that she never wants to go anywhere.  She reports that this baby has been more dependent on her.  She states that she does have minimal help.  Reports that she does cry a lot.  She denies being suicidal but states that if she was to die tomorrow she would not care.  She has no thoughts of harming the baby although she does feel as if she is distancing herself from the baby.  The patient also reports that she began having headaches after delivery.  She has been having approximately 3-4 headaches a week.  Headaches usually start in the back of the head on the right side and radiates to the frontal region.  She states that she has had a headache for the last 2 weeks.  She has tried Fioricet with no benefit.  She reports that she was recently placed on gabapentin for nerve pain in her shoulder.  She reports that this was increased yesterday.  She is unsure what the increased dose is as she has not started taking it yet.  She returns today for an evaluation.    REVIEW OF SYSTEMS: Out of a complete 14 system review of symptoms, the patient complains only of the following symptoms, and all other reviewed systems are negative.  See HPI  ALLERGIES: Allergies  Allergen Reactions  . Citalopram Shortness Of Breath  . Bee Pollen Hives  . Lorazepam Other (See Comments)    hallucinations hallucinations   . Hydrocodone Nausea And Vomiting    hallucinations  . [name] [Lurasidone  Hcl] Other (See Comments)    hallucinations    HOME MEDICATIONS: Outpatient Medications Prior to Visit  Medication Sig Dispense Refill  . butalbital-acetaminophen-caffeine (FIORICET) 50-325-40 MG tablet Take 1 tablet by mouth every 6 (six) hours as needed for headache.    . gabapentin (NEURONTIN) 300 MG capsule Take by mouth.    Marland Kitchen ibuprofen (ADVIL) 800 MG tablet Take by mouth.    . clobetasol ointment (TEMOVATE) 5.36 % Apply 1 application topically 2 (two) times daily as needed (psoriasis).     Marland Kitchen doxylamine, Sleep, (UNISOM) 25 MG tablet Take 25 mg by mouth at bedtime as needed.    . ondansetron (ZOFRAN) 4 MG tablet Take 4 mg by mouth every 8 (eight) hours as needed for nausea or vomiting.     No facility-administered medications prior to visit.     PAST MEDICAL HISTORY: Past Medical History:  Diagnosis Date  . Headache    "headaches daily"  . Seizure ([place])    since 2015    PAST SURGICAL HISTORY: Past Surgical History:  Procedure Laterality Date  . FRACTURE SURGERY Right    elbow  . TONSILLECTOMY      FAMILY HISTORY: Family History  Adopted: Yes    SOCIAL HISTORY: Social History   Socioeconomic History  . Marital status: Single    Spouse name: Not on file  . Number of  children: 1  . Years of education: 41  . Highest education level: Not on file  Occupational History    Comment: retail store  Social Needs  . Financial resource strain: Not on file  . Food insecurity    Worry: Not on file    Inability: Not on file  . Transportation needs    Medical: Not on file    Non-medical: Not on file  Tobacco Use  . Smoking status: Former Smoker    Packs/day: 2.00    Quit date: 07/21/2011    Years since quitting: 7.8  . Smokeless tobacco: Never Used  . Tobacco comment: quit 5 years ago   Substance and Sexual Activity  . Alcohol use: No  . Drug use: No  . Sexual activity: Not on file  Lifestyle  . Physical activity    Days per week: Not on file    Minutes per  session: Not on file  . Stress: Not on file  Relationships  . Social Herbalist on phone: Not on file    Gets together: Not on file    Attends religious service: Not on file    Active member of club or organization: Not on file    Attends meetings of clubs or organizations: Not on file    Relationship status: Not on file  . Intimate partner violence    Fear of current or ex partner: Not on file    Emotionally abused: Not on file    Physically abused: Not on file    Forced sexual activity: Not on file  Other Topics Concern  . Not on file  Social History Narrative   Lives alone with son   Caffeine- coffee, 1-2 cups daily, soda 2 daily      PHYSICAL EXAM  Vitals:   05/09/19 0925  BP: 125/84  Pulse: 82  Temp: 97.8 F (36.6 C)  Weight: 159 lb 3.2 oz (72.2 kg)  Height: _0  (1.651 m)   Body mass index is 26.49 kg/m.  Generalized: Well developed, in no acute distress   Neurological examination  Mentation: Alert oriented to time, place, history taking. Follows all commands speech and language fluent Cranial nerve II-XII: Pupils were equal round reactive to light. Extraocular movements were full, visual field were full on confrontational test.  Head turning and shoulder shrug  were normal and symmetric. Motor: The motor testing reveals 5 over 5 strength of all 4 extremities. Good symmetric motor tone is noted throughout.  Sensory: Sensory testing is intact to soft touch on all 4 extremities. No evidence of extinction is noted.  Coordination: Cerebellar testing reveals good finger-nose-finger and heel-to-shin bilaterally.  Gait and station: Gait is normal. Reflexes: Deep tendon reflexes are symmetric and normal bilaterally.   DIAGNOSTIC DATA (LABS, IMAGING, TESTING) - I reviewed patient records, labs, notes, testing and imaging myself where available.  No results found for: WBC, HGB, HCT, MCV, PLT    Component Value Date/Time   NA 144 09/23/2009 2254   K 3.9  09/23/2009 2254   CL 108 09/23/2009 2254   CO2 25 09/23/2009 2254   GLUCOSE 86 09/23/2009 2254   BUN 5 (L) 09/23/2009 2254   CREATININE 0.7 09/23/2009 2254   CALCIUM 9.5 09/23/2009 2254   GFRNONAA >60 09/23/2009 2254   GFRAA  09/23/2009 2254    >60        The eGFR has been calculated using the MDRD equation. This calculation has not been validated in all  clinical situations. eGFR's persistently <60 mL/min signify possible Chronic Kidney Disease.      ASSESSMENT AND PLAN 28 y.o. year old female  has a past medical history of Headache and Seizure ([place]). here with:  1.  Migraine headaches  -Patient will get a Depacon infusion today for current headache 500 mg IV can repeat if needed. -Gabapentin was recently increased.  Patient advised that gabapentin can be helpful for her headaches.  She will call with the dosing.  2.  Postpartum depression  -Patient was advised that she needs to set up an appointment with her OB/GYN. -RN was going to call for the patient today to get her an appointment this week. -Patient was advised that if she has thoughts of harming herself or the baby she should call 911 or go to the emergency room.  Patient is advised that if her symptoms worsen or she develops new symptoms she should let us know.  She will follow-up in 6 months or sooner if needed.      Ward Givens, MSN, NP-C 05/09/2019, 9:47 AM [facility] [address] [place], [place] [address] [phone]

## 2019-05-13 ENCOUNTER — Telehealth: Payer: Self-pay | Admitting: Adult Health

## 2019-05-13 NOTE — Telephone Encounter (Signed)
I donot see that we prescribed this. I called pt.  She stated Dr. Leward Quan ordered effexor 75mg  po qhs.  She has not taken yet.  She wanted our input, due to her having pseudoseizures.  Is would effexor be ok to take (should she take another medication like keppra along with this).  Please advise.

## 2019-05-13 NOTE — Telephone Encounter (Signed)
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  So she should be okay with taking Effexor.  However if she is not comfortable with this she should discuss with her OB/GYN.

## 2019-05-13 NOTE — Telephone Encounter (Signed)
Pt called wanting to discuss a medication that was given to her and that it states one of the side effects is seizures. She was given Venlafaxine 75mg .  Please advise.

## 2019-05-16 NOTE — Telephone Encounter (Signed)
Pt returned call.  I relayed that per MM/NP note, that after reviewing her notes and her sz nonepileptic that she should be okay with taking the effexor.  If she is not comfortable with this to discuss with ob/gyn.  She has appt today with her ob today.  I relayed that she needs to get referral name of psych they referred her to and then call to see about making appt.  She will do this and call us back as needed.

## 2019-05-17 NOTE — Progress Notes (Signed)
I reviewed note and agree with plan.   VIKRAM R. PENUMALLI, MD 05/17/2019, 9:51 AM Certified in Neurology, Neurophysiology and Neuroimaging  [facility] [address], [address] [place], [place] [address] [phone]  

## 2019-05-23 ENCOUNTER — Telehealth: Payer: Self-pay | Admitting: Adult Health

## 2019-05-23 MED ORDER — AIMOVIG 140 MG/ML ~~LOC~~ SOAJ: 140.0000 mg | SUBCUTANEOUS | 5 refills | Status: DC

## 2019-05-23 MED ORDER — PREDNISONE 5 MG PO TABS: ORAL_TABLET | ORAL | 0 refills | Status: DC

## 2019-05-23 NOTE — Telephone Encounter (Signed)
Patient reports that she has had a headache for 5 days.  Her headache occurs in the occipital region and extends down to the neck.  She does have photophobia and phonophobia as well as nausea.  She states that she is having approximately 4-5 headaches a week.  She is currently on gabapentin 300 mg 3 times a day.  In the past she has had prednisone and states that she tolerates it well.  I will give her prednisone Dosepak for her current headache.  I will also put her on Aimovig 140 mg monthly as a preventative medication for her migraines.  I have reviewed potential side effects of both of these medications.  Patient voiced understanding.

## 2019-05-23 NOTE — Telephone Encounter (Signed)
Pt called in wanting to schedule a appt for botox injections, pt states she has had a migraine for 5 days, looking into the patient history I dont see where she has previously received botox injections, pt states she is unsure the type of injection that she previously was receiving.

## 2019-07-15 ENCOUNTER — Telehealth: Payer: Self-pay

## 2019-07-15 NOTE — Telephone Encounter (Signed)
Patient called stating Dr.Varghese is requesting patient get an MRI done due to her seizures. Please follow up

## 2019-07-16 NOTE — Telephone Encounter (Signed)
Is she having seizures? In the past she was diagnosed with pseudoseizures. Can we get notes from Dr. Baldo Daub

## 2019-07-16 NOTE — Telephone Encounter (Signed)
I called pt back.  She stated that since she has been on effexor 75mg  po daily, and buproprion 100mg  po bid for the last 2 months, she has noted 3-4 auras daily.  No seizures.  Her psychiatrist , Dr. has seen her once and wanted to get EEG and MRI on pt, relating to changing medications for her.  She will check will her to see about getting her last note, (gave her fax # [phone] and she will mostl likely have to sign release.  Next appt with Baldo Daub 11-06-19.

## 2019-07-18 NOTE — Telephone Encounter (Signed)
Can we follow-up with patient and she if she was able to get notes. We can also schedule for sooner follow-up with me or MD

## 2019-07-18 NOTE — Telephone Encounter (Signed)
I called pt and she will sign release tomorrow and get notes to [place].

## 2019-07-29 NOTE — Telephone Encounter (Signed)
LMVM for [facility] [place], [place] [phone] about pt and ROI for last notes from Dr. Baldo Daub.  Left fax # concerning notes [phone].

## 2019-07-29 NOTE — Telephone Encounter (Signed)
Reviewed notes. Please call and schedule her for an appointment

## 2019-07-29 NOTE — Telephone Encounter (Signed)
Received, to MM inbox.

## 2019-07-29 NOTE — Telephone Encounter (Signed)
Can you follow up on this?

## 2019-07-30 NOTE — Telephone Encounter (Signed)
Called and could not LM, VM full.

## 2019-07-30 NOTE — Telephone Encounter (Signed)
I tried to call again and could not reach pt.

## 2019-07-30 NOTE — Telephone Encounter (Signed)
I called pt and made appt for her on 08-01-19 at 0930.

## 2019-08-01 ENCOUNTER — Ambulatory Visit: Payer: Medicaid Other | Admitting: Adult Health

## 2019-08-01 ENCOUNTER — Encounter: Payer: Self-pay | Admitting: Adult Health

## 2019-08-01 ENCOUNTER — Other Ambulatory Visit: Payer: Self-pay

## 2019-08-01 VITALS — BP 104/80 | HR 99 | Temp 97.3°F | Ht 65.0 in | Wt 154.6 lb

## 2019-08-01 DIAGNOSIS — G935 Compression of brain: Secondary | ICD-10-CM

## 2019-08-01 DIAGNOSIS — G43111 Migraine with aura, intractable, with status migrainosus: Secondary | ICD-10-CM | POA: Diagnosis not present

## 2019-08-01 DIAGNOSIS — F445 Conversion disorder with seizures or convulsions: Secondary | ICD-10-CM | POA: Diagnosis not present

## 2019-08-01 NOTE — Progress Notes (Signed)
PATIENT: Sheri Perry DOB: [birth year]  REASON FOR VISIT: follow up HISTORY FROM: patient  HISTORY OF PRESENT ILLNESS: Today 08/01/19:  Sheri Perry is a 29 year old female with a history of migraine headaches.  She returns today for follow-up.  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  The patient states that she has what she calls " feelings of seizures" on a daily basis.  She describes this by reporting that  her body feels weird, she sees colorful lights in her peripheral vision.  She sometimes has tingling in the hands and the toes.  She states that these events can last approximately 30 seconds to 1 minute.  She reports that she has headaches on a daily basis.  However her headaches tend to get worse after these events.  Her headaches are typically in the occipital region or behind the eyes.  She does have photophobia and phonophobia.  She states that with her headache she will develop double vision.  She was started on Aimovig in November.  She reports that she just started noticing some benefit in the severity of her headache.  She takes Fioricet every other day.  She returns today for an evaluation.  Her initial work-up for seizures/pseudoseizures was done by [facility].  HISTORY 05/09/19:  Sheri Perry is a 29 year old female with a history of migraine headaches.  She returns today for follow-up.  She recently had a baby in June.  She states that she feels as if she is suffering from postpartum depression.  She has not followed with her OB/GYN for this.  She states that most days she feels miserable.  States that she does not feel like getting ready for the day.  She states that she never wants to go anywhere.  She reports that this baby has been more dependent on her.  She states that she does have minimal help.  Reports that she does cry a lot.  She denies being suicidal  but states that if she was to die tomorrow she would not care.  She has no thoughts of harming the baby although she does feel as if she is distancing herself from the baby.  REVIEW OF SYSTEMS: Out of a complete 14 system review of symptoms, the patient complains only of the following symptoms, and all other reviewed systems are negative.  See HPI  ALLERGIES: Allergies  Allergen Reactions  . Citalopram Shortness Of Breath  . Bee Pollen Hives  . Lorazepam Other (See Comments)    hallucinations hallucinations   . Hydrocodone Nausea And Vomiting    hallucinations  . [name] [Lurasidone Hcl] Other (See Comments)    hallucinations    HOME MEDICATIONS: Outpatient Medications Prior to Visit  Medication Sig Dispense Refill  . buPROPion (WELLBUTRIN) 100 MG tablet Take 100 mg by mouth 2 (two) times daily.    . butalbital-acetaminophen-caffeine (FIORICET) 50-325-40 MG tablet Take 1 tablet by mouth every 6 (six) hours as needed for headache.    Eduard Roux (AIMOVIG) 140 MG/ML SOAJ Inject 140 mg into the skin every 30 (thirty) days. 1.12 mg 5  . gabapentin (NEURONTIN) 300 MG capsule Take by mouth.    Marland Kitchen ibuprofen (ADVIL) 800 MG tablet Take by mouth.    . lamoTRIgine (LAMICTAL) 25 MG tablet Take 25 mg by mouth daily.    . predniSONE (DELTASONE) 5 MG tablet Begin taking 6 tablets daily, taper by one tablet daily until  off the medication. 21 tablet 0  . venlafaxine (EFFEXOR) 75 MG tablet Take 75 mg by mouth at bedtime.     No facility-administered medications prior to visit.    PAST MEDICAL HISTORY: Past Medical History:  Diagnosis Date  . Headache    "headaches daily"  . Seizure ([place])    since 2015    PAST SURGICAL HISTORY: Past Surgical History:  Procedure Laterality Date  . FRACTURE SURGERY Right    elbow  . TONSILLECTOMY      FAMILY HISTORY: Family History  Adopted: Yes    SOCIAL HISTORY: Social History   Socioeconomic History  . Marital status: Single    Spouse  name: Not on file  . Number of children: 1  . Years of education: 86  . Highest education level: Not on file  Occupational History    Comment: retail store  Tobacco Use  . Smoking status: Former Smoker    Packs/day: 2.00    Quit date: 07/21/2011    Years since quitting: 8.0  . Smokeless tobacco: Never Used  . Tobacco comment: quit 5 years ago   Substance and Sexual Activity  . Alcohol use: No  . Drug use: No  . Sexual activity: Not on file  Other Topics Concern  . Not on file  Social History Narrative   Lives alone with son   Caffeine- coffee, 1-2 cups daily, soda 2 daily   Social Determinants of Health   Financial Resource Strain:   . Difficulty of Paying Living Expenses: Not on file  Food Insecurity:   . Worried About Charity fundraiser in the Last Year: Not on file  . Ran Out of Food in the Last Year: Not on file  Transportation Needs:   . Lack of Transportation (Medical): Not on file  . Lack of Transportation (Non-Medical): Not on file  Physical Activity:   . Days of Exercise per Week: Not on file  . Minutes of Exercise per Session: Not on file  Stress:   . Feeling of Stress : Not on file  Social Connections:   . Frequency of Communication with Friends and Family: Not on file  . Frequency of Social Gatherings with Friends and Family: Not on file  . Attends Religious Services: Not on file  . Active Member of Clubs or Organizations: Not on file  . Attends Archivist Meetings: Not on file  . Marital Status: Not on file  Intimate Partner Violence:   . Fear of Current or Ex-Partner: Not on file  . Emotionally Abused: Not on file  . Physically Abused: Not on file  . Sexually Abused: Not on file      PHYSICAL EXAM  Vitals:   08/01/19 0916  BP: 104/80  Pulse: 99  Temp: (!) 97.3 F (36.3 C)  TempSrc: Oral  Weight: 154 lb 9.6 oz (70.1 kg)  Height: '5\' 5"'  (1.651 m)   Body mass index is 25.73 kg/m.  Generalized: Well developed, in no acute  distress   Neurological examination  Mentation: Alert oriented to time, place, history taking. Follows all commands speech and language fluent Cranial nerve II-XII: Pupils were equal round reactive to light. Extraocular movements were full, visual field were full on confrontational test. Facial sensation and strength were normal. Uvula tongue midline. Head turning and shoulder shrug  were normal and symmetric. Motor: The motor testing reveals 5 over 5 strength of all 4 extremities. Good symmetric motor tone is noted throughout.  Sensory: Sensory testing  is intact to soft touch on all 4 extremities. No evidence of extinction is noted.  Coordination: Cerebellar testing reveals good finger-nose-finger and heel-to-shin bilaterally.  Gait and station: Gait is normal. Tandem gait is unsteady.  Romberg is negative. No drift is seen.  Reflexes: Deep tendon reflexes are symmetric and normal bilaterally.   DIAGNOSTIC DATA (LABS, IMAGING, TESTING) - I reviewed patient records, labs, notes, testing and imaging myself where available.  No results found for: WBC, HGB, HCT, MCV, PLT    Component Value Date/Time   NA 144 09/23/2009 2254   K 3.9 09/23/2009 2254   CL 108 09/23/2009 2254   CO2 25 09/23/2009 2254   GLUCOSE 86 09/23/2009 2254   BUN 5 (L) 09/23/2009 2254   CREATININE 0.7 09/23/2009 2254   CALCIUM 9.5 09/23/2009 2254   GFRNONAA >60 09/23/2009 2254   GFRAA  09/23/2009 2254    >60        The eGFR has been calculated using the MDRD equation. This calculation has not been validated in all clinical situations. eGFR's persistently <60 mL/min signify possible Chronic Kidney Disease.      ASSESSMENT AND PLAN 29 y.o. year old female  has a past medical history of Headache and Seizure ([place]). here with:  1.  Migraine headaches  -These new symptoms could represent an aura before her migraines. -Continue on Aimovig -Limit use of Fioricet as it can cause rebound headaches -MRI of the  brain with and without contrast  2.  History of pseudoseizure  -Repeat EEG -MRI of the brain with and without contrast -Advised patient to get medical history from [facility] to share with her psychiatrist  3.  History of Arnold-Chiari malformation type I  -MRI of the brain with and without contrast  Advised patient that if her symptoms worsen or she develops new symptoms she should let us know.  She will follow-up in 3 months or sooner if needed     Ward Givens, MSN, NP-C 08/01/2019, 9:43 AM [facility] [address] [place], [place] [address] [phone]

## 2019-08-05 NOTE — Progress Notes (Signed)
I reviewed note and agree with plan.   Suanne Marker, MD 08/05/2019, 9:53 AM Certified in Neurology, Neurophysiology and Neuroimaging  [facility] [address] [place], [place] [address] [phone]

## 2019-08-15 ENCOUNTER — Other Ambulatory Visit: Payer: Medicaid Other

## 2019-08-19 ENCOUNTER — Other Ambulatory Visit: Payer: Medicaid Other

## 2019-08-28 ENCOUNTER — Other Ambulatory Visit: Payer: Medicaid Other

## 2019-08-30 ENCOUNTER — Ambulatory Visit
Admission: RE | Admit: 2019-08-30 | Discharge: 2019-08-30 | Disposition: A | Discharge status: Home / Self Care | Payer: Medicaid Other | Source: Ambulatory Visit | Attending: Adult Health | Admitting: Adult Health

## 2019-08-30 DIAGNOSIS — G43111 Migraine with aura, intractable, with status migrainosus: Secondary | ICD-10-CM

## 2019-08-30 MED ORDER — GADOBENATE DIMEGLUMINE 529 MG/ML IV SOLN
14.0000 mL | Freq: Once | INTRAVENOUS | Status: AC | PRN
  Administered 2019-08-30: 14 mL via INTRAVENOUS

## 2019-09-02 ENCOUNTER — Telehealth: Payer: Self-pay

## 2019-09-02 NOTE — Telephone Encounter (Signed)
-----   Message from Butch Penny, NP sent at 09/02/2019 10:30 AM EST ----- MRI is normal no acute changes. Please callpatient

## 2019-09-02 NOTE — Telephone Encounter (Signed)
Spoke with the patient and she stated that her symptoms are getting worse. She mentioned whenever she has a migraine she gets dizzy and feels as if she is going to pass out, she has pressure in the back of her head to where she is unable to lay down on a pillow and she has a burning sensation to the back of the neck. Please advise.

## 2019-09-03 NOTE — Telephone Encounter (Signed)
She has EEG scheduled for next Monday. We will wait for these results before moving forward. She should let her psychiatrist know that MRI was normal.

## 2019-09-04 NOTE — Telephone Encounter (Signed)
Unable to get in contact with the patient. LVM letting her know that Aundra Millet would like to see the results of her EEG before moving forward. Patient was advised to let her psychiatrist know that her MRI was normal. Office number was provided in case she has any questions or concerns.

## 2019-09-09 ENCOUNTER — Ambulatory Visit: Payer: Medicaid Other

## 2019-09-09 ENCOUNTER — Other Ambulatory Visit: Payer: Self-pay

## 2019-09-09 DIAGNOSIS — F445 Conversion disorder with seizures or convulsions: Secondary | ICD-10-CM

## 2019-09-10 NOTE — Procedures (Signed)
   [facility]  EEG (ELECTROENCEPHALOGRAM) REPORT   STUDY DATE: 09/09/19 PATIENT NAME: Kanita Delage DOB: [birth year] MRN: [id number]  ORDERING CLINICIAN: Butch Penny, NP   TECHNOLOGIST: Ardis Hughs TECHNIQUE: Electroencephalogram was recorded utilizing standard 10-20 system of lead placement and reformatted into average and bipolar montages.  RECORDING TIME: 24 minutes  ACTIVATION: hyperventilation and photic stimulation  CLINICAL INFORMATION: 28 year old female with abnormal spells.  FINDINGS: Posterior dominant background rhythms, which attenuate with eye opening, ranging 10-11 hertz and 25-30 microvolts. No focal, lateralizing, epileptiform activity or seizures are seen. Patient recorded in the awake and drowsy state. EKG channel shows regular rhythm of 60-70 beats per minute.   IMPRESSION:   Normal EEG in the awake and drowsy states.    INTERPRETING PHYSICIAN:  Suanne Marker, MD Certified in Neurology, Neurophysiology and Neuroimaging  [facility] [address] [place], [place] [address] [phone]

## 2019-09-11 ENCOUNTER — Telehealth: Payer: Self-pay

## 2019-09-11 NOTE — Telephone Encounter (Signed)
-----   Message from Butch Penny, NP sent at 09/11/2019  8:06 AM EST ----- EEG normal. If she is still having symptoms please schedule her for a follow-up appointment.

## 2019-09-11 NOTE — Telephone Encounter (Signed)
Spoke with the patient about her EEG results. She verbalized understanding. She also mentioned that she was still having the symptoms. She has been scheduled for a follow up with Butch Penny, NP on 09-24-2019 at 3:00 pm. Patient is aware of appt date and time.

## 2019-09-24 ENCOUNTER — Other Ambulatory Visit: Payer: Self-pay

## 2019-09-24 ENCOUNTER — Encounter: Payer: Self-pay | Admitting: Adult Health

## 2019-09-24 ENCOUNTER — Ambulatory Visit: Payer: Medicaid Other | Admitting: Adult Health

## 2019-09-24 VITALS — BP 110/72 | HR 91 | Temp 97.8°F | Ht 65.0 in | Wt 152.4 lb

## 2019-09-24 DIAGNOSIS — G43111 Migraine with aura, intractable, with status migrainosus: Secondary | ICD-10-CM | POA: Diagnosis not present

## 2019-09-24 MED ORDER — TIZANIDINE HCL 2 MG PO CAPS: 2.0000 mg | ORAL_CAPSULE | Freq: Every day | ORAL | 5 refills | Status: DC

## 2019-09-24 NOTE — Progress Notes (Signed)
PATIENT: Sheri Perry DOB: 1991/06/23  REASON FOR VISIT: follow up HISTORY FROM: patient  HISTORY OF PRESENT ILLNESS: Today 09/24/19:  Sheri Perry is a 29 year old female with a history of migraine headaches.  She returns today for follow-up.  Her psychiatrist had some concerns about seizure activity.  We repeated MRI of the brain and EEG all of which was unremarkable.  The patient reports that she is having pain in the back of the neck that travels up to the occipital region.  She describes it as a tightening sensation.  She states that she does have photophobia and phonophobia as well as dizziness.  She states that this is how her headaches have been presenting here recently.  She has had this headache for 6 days.  In the past we have tried Depakote, Toradol, prednisone and trigger point injections with minimal benefit.  She continues on Aimovig her last dose was March 21.  She has the Mirena IUD.  She is currently on gabapentin, Lamictal and Effexor prescribed by her psychiatrist.  HISTORY 08/01/19:  Sheri Perry is a 29 year old female with a history of migraine headaches.  She returns today for follow-up.  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  The patient states that she has what she calls " feelings of seizures" on a daily basis.  She describes this by reporting that  her body feels weird, she sees colorful lights in her peripheral vision.  She sometimes has tingling in the hands and the toes.  She states that these events can last approximately 30 seconds to 1 minute.  She reports that she has headaches on a daily basis.  However her headaches tend to get worse after these events.  Her headaches are typically in the occipital region or behind the eyes.  She does have photophobia and phonophobia.  She states that with her headache she will develop double vision.  She was started on  Aimovig in November.  She reports that she just started noticing some benefit in the severity of her headache.  She takes Fioricet every other day.  She returns today for an evaluation.   REVIEW OF SYSTEMS: Out of a complete 14 system review of symptoms, the patient complains only of the following symptoms, and all other reviewed systems are negative.  ALLERGIES: Allergies  Allergen Reactions  . Citalopram Shortness Of Breath  . Bee Pollen Hives  . Lorazepam Other (See Comments)    hallucinations hallucinations   . Hydrocodone Nausea And Vomiting    hallucinations  . [name] [Lurasidone Hcl] Other (See Comments)    hallucinations    HOME MEDICATIONS: Outpatient Medications Prior to Visit  Medication Sig Dispense Refill  . buPROPion (WELLBUTRIN) 100 MG tablet Take 100 mg by mouth 2 (two) times daily.    Eduard Roux (AIMOVIG) 140 MG/ML SOAJ Inject 140 mg into the skin every 30 (thirty) days. 1.12 mg 5  . gabapentin (NEURONTIN) 300 MG capsule Take by mouth.    Marland Kitchen ibuprofen (ADVIL) 800 MG tablet Take by mouth.    . lamoTRIgine (LAMICTAL) 25 MG tablet Take 25 mg by mouth daily.    Marland Kitchen venlafaxine (EFFEXOR) 75 MG tablet Take 75 mg by mouth at bedtime.    . butalbital-acetaminophen-caffeine (FIORICET) 50-325-40 MG tablet Take 1 tablet by mouth every 6 (six) hours as needed for headache.     No facility-administered medications prior to visit.    PAST  MEDICAL HISTORY: Past Medical History:  Diagnosis Date  . Headache    "headaches daily"  . Seizure ([place])    since 2015    PAST SURGICAL HISTORY: Past Surgical History:  Procedure Laterality Date  . FRACTURE SURGERY Right    elbow  . TONSILLECTOMY      FAMILY HISTORY: Family History  Adopted: Yes    SOCIAL HISTORY: Social History   Socioeconomic History  . Marital status: Single    Spouse name: Not on file  . Number of children: 1  . Years of education: 53  . Highest education level: Not on file  Occupational  History    Comment: retail store  Tobacco Use  . Smoking status: Former Smoker    Packs/day: 2.00    Quit date: 07/21/2011    Years since quitting: 8.1  . Smokeless tobacco: Never Used  . Tobacco comment: quit 5 years ago   Substance and Sexual Activity  . Alcohol use: No  . Drug use: No  . Sexual activity: Not on file  Other Topics Concern  . Not on file  Social History Narrative   Lives alone with son   Caffeine- coffee, 1-2 cups daily, soda 2 daily   Social Determinants of Health   Financial Resource Strain:   . Difficulty of Paying Living Expenses:   Food Insecurity:   . Worried About Charity fundraiser in the Last Year:   . Arboriculturist in the Last Year:   Transportation Needs:   . Film/video editor (Medical):   Marland Kitchen Lack of Transportation (Non-Medical):   Physical Activity:   . Days of Exercise per Week:   . Minutes of Exercise per Session:   Stress:   . Feeling of Stress :   Social Connections:   . Frequency of Communication with Friends and Family:   . Frequency of Social Gatherings with Friends and Family:   . Attends Religious Services:   . Active Member of Clubs or Organizations:   . Attends Archivist Meetings:   Marland Kitchen Marital Status:   Intimate Partner Violence:   . Fear of Current or Ex-Partner:   . Emotionally Abused:   Marland Kitchen Physically Abused:   . Sexually Abused:       PHYSICAL EXAM  Vitals:   09/24/19 1438  BP: 110/72  Pulse: 91  Temp: 97.8 F (36.6 C)  Weight: 152 lb 6.4 oz (69.1 kg)  Height: '5\' 5"'  (1.651 m)   Body mass index is 25.36 kg/m.  Generalized: Well developed, in no acute distress   Neurological examination  Mentation: Alert oriented to time, place, history taking. Follows all commands speech and language fluent Cranial nerve II-XII: Pupils were equal round reactive to light. Extraocular movements were full, visual field were full on confrontational test. Facial sensation and strength were normal.  Head turning  and shoulder shrug  were normal and symmetric. Motor: The motor testing reveals 5 over 5 strength of all 4 extremities. Good symmetric motor tone is noted throughout.  Sensory: Sensory testing is intact to soft touch on all 4 extremities. No evidence of extinction is noted.  Coordination: Cerebellar testing reveals good finger-nose-finger and heel-to-shin bilaterally.  Gait and station: Gait is normal. Tandem gait is normal. Romberg is negative. No drift is seen.  Reflexes: Deep tendon reflexes are symmetric and normal bilaterally.   DIAGNOSTIC DATA (LABS, IMAGING, TESTING) - I reviewed patient records, labs, notes, testing and imaging myself where available.  No results  found for: WBC, HGB, HCT, MCV, PLT    Component Value Date/Time   NA 144 09/23/2009 2254   K 3.9 09/23/2009 2254   CL 108 09/23/2009 2254   CO2 25 09/23/2009 2254   GLUCOSE 86 09/23/2009 2254   BUN 5 (L) 09/23/2009 2254   CREATININE 0.7 09/23/2009 2254   CALCIUM 9.5 09/23/2009 2254   GFRNONAA >60 09/23/2009 2254   GFRAA  09/23/2009 2254    >60        The eGFR has been calculated using the MDRD equation. This calculation has not been validated in all clinical situations. eGFR's persistently <60 mL/min signify possible Chronic Kidney Disease.      ASSESSMENT AND PLAN 29 y.o. year old female  has a past medical history of Headache and Seizure ([place]). here with :  1.  Migraine headaches  -Continue Aimovig -Start tizanidine 2 mg at bedtime -Reviewed potential side effects with the patient and provided her with a handout -Advised that if her headache frequency does not improve she should let us know -Follow-up in 6 months or sooner if needed   I spent 30 minutes of face-to-face and non-face-to-face time with patient.  This included previsit chart review, lab review, study review, order entry, electronic health record documentation, patient education.  Ward Givens, MSN, NP-C 09/24/2019, 3:00 PM [facility] [address] [place], [place] [address] [phone]

## 2019-09-24 NOTE — Patient Instructions (Addendum)
Your Plan:  Continue Aimovig  Start tizanidine 2 mg at bedtime If your symptoms worsen or you develop new symptoms please let us know.    Thank you for coming to see [place] at [facility]. I hope we have been able to provide you high quality care today.  You may receive a patient satisfaction survey over the next few weeks. We would appreciate your feedback and comments so that we may continue to improve ourselves and the health of our patients.  Tizanidine tablets or capsules What is this medicine? TIZANIDINE (tye ZAN i deen) helps to relieve muscle spasms. It may be used to help in the treatment of multiple sclerosis and spinal cord injury. This medicine may be used for other purposes; ask your health care provider or pharmacist if you have questions. COMMON BRAND NAME(S): Zanaflex What should I tell my health care provider before I take this medicine? They need to know if you have any of these conditions:  kidney disease  liver disease  low blood pressure  mental disorder  an unusual or allergic reaction to tizanidine, other medicines, lactose (tablets only), foods, dyes, or preservatives  pregnant or trying to get pregnant  breast-feeding How should I use this medicine? Take this medicine by mouth with a full glass of water. Take this medicine on an empty stomach, at least 30 minutes before or 2 hours after food. Do not take with food unless you talk with your doctor. Follow the directions on the prescription label. Take your medicine at regular intervals. Do not take your medicine more often than directed. Do not stop taking except on your doctor's advice. Suddenly stopping the medicine can be very dangerous. Talk to your pediatrician regarding the use of this medicine in children. Patients over 5 years old may have a stronger reaction and need a smaller dose. Overdosage: If you think you have taken too much of this medicine contact a poison control center or  emergency room at once. NOTE: This medicine is only for you. Do not share this medicine with others. What if I miss a dose? If you miss a dose, take it as soon as you can. If it is almost time for your next dose, take only that dose. Do not take double or extra doses. What may interact with this medicine? Do not take this medicine with any of the following medications:  ciprofloxacin  fluvoxamine  narcotic medicines for cough  thiabendazole This medicine may also interact with the following medications:  acyclovir  alcohol  antihistamines for allergy, cough, and cold  baclofen  certain medicines for anxiety or sleep  certain medicines for blood pressure, heart disease, irregular heartbeat  certain medicines for depression like amitriptyline, fluoxetine, sertraline  certain medicines for seizures like phenobarbital, primidone  certain medicines for stomach problems like cimetidine, famotidine  female hormones, like estrogens or progestins and birth control pills, patches, rings, or injections  general anesthetics like halothane, isoflurane, methoxyflurane, propofol  local anesthetics like lidocaine, pramoxine, tetracaine  medicines that relax muscles for surgery  narcotic medicines for pain  phenothiazines like chlorpromazine, mesoridazine, prochlorperazine  ticlopidine  zileuton This list may not describe all possible interactions. Give your health care provider a list of all the medicines, herbs, non-prescription drugs, or dietary supplements you use. Also tell them if you smoke, drink alcohol, or use illegal drugs. Some items may interact with your medicine. What should I watch for while using this medicine? Tell your doctor or health care professional  if your symptoms do not start to get better or if they get worse. You may get drowsy or dizzy. Do not drive, use machinery, or do anything that needs mental alertness until you know how this medicine affects you.  Do not stand or sit up quickly, especially if you are an older patient. This reduces the risk of dizzy or fainting spells. Alcohol may interfere with the effect of this medicine. Avoid alcoholic drinks. If you are taking another medicine that also causes drowsiness, you may have more side effects. Give your health care provider a list of all medicines you use. Your doctor will tell you how much medicine to take. Do not take more medicine than directed. Call emergency for help if you have problems breathing or unusual sleepiness. Your mouth may get dry. Chewing sugarless gum or sucking hard candy, and drinking plenty of water may help. Contact your doctor if the problem does not go away or is severe. What side effects may I notice from receiving this medicine? Side effects that you should report to your doctor or health care professional as soon as possible:  allergic reactions like skin rash, itching or hives, swelling of the face, lips, or tongue  breathing problems  hallucinations  signs and symptoms of liver injury like dark yellow or brown urine; general ill feeling or flu-like symptoms; light-colored stools; loss of appetite; nausea; right upper quadrant belly pain; unusually weak or tired; yellowing of the eyes or skin  signs and symptoms of low blood pressure like dizziness; feeling faint or lightheaded, falls; unusually weak or tired  unusually slow heartbeat  unusually weak or tired Side effects that usually do not require medical attention (report to your doctor or health care professional if they continue or are bothersome):  blurred vision  constipation  dizziness  dry mouth  tiredness This list may not describe all possible side effects. Call your doctor for medical advice about side effects. You may report side effects to FDA at 1-800-FDA-1088. Where should I keep my medicine? Keep out of the reach of children. Store at room temperature between 15 and 30 degrees C (59  and 86 degrees F). Throw away any unused medicine after the expiration date. NOTE: This sheet is a summary. It may not cover all possible information. If you have questions about this medicine, talk to your doctor, pharmacist, or health care provider.  2020 Elsevier/Gold Standard (2017-04-04 13:33:29)

## 2019-09-30 NOTE — Progress Notes (Signed)
I reviewed note and agree with plan.   Awab Abebe R. Georg Ang, MD 09/30/2019, 5:58 PM Certified in Neurology, Neurophysiology and Neuroimaging  [facility] [address], [address] [place], [place] [address] [phone]  

## 2019-11-04 ENCOUNTER — Telehealth: Payer: Self-pay | Admitting: Adult Health

## 2019-11-04 NOTE — Telephone Encounter (Signed)
Pt called stating that the muscle relaxer that she is on is not helping any longer and she is wanting to know the dosage can be increased or if she is to get back on her old medication. Please advise.

## 2019-11-04 NOTE — Telephone Encounter (Signed)
Spoke to pt.  She has had migraine for about 1.5 weeks.  She is taking aimovig, tizanidine , questioning if needs higher dose of tizanidine ? Or if go back on nortirptyline.  Daily headache, even wakes at night.  Had food poisoning last week and since then has been getting worse.  Please advise.

## 2019-11-05 MED ORDER — TIZANIDINE HCL 4 MG PO CAPS: 4.0000 mg | ORAL_CAPSULE | Freq: Every day | ORAL | 0 refills | Status: DC

## 2019-11-05 NOTE — Telephone Encounter (Signed)
I called pt. She is agreeable to trying tizanidine 4mg  QHS to help her migraine. She is running low on tizanidine 2mg  capsules and asked if the tizanidine 4mg  capsule can be sent in to [facility] in [place]. I have completed this. Pt will let know if this does not help her migraine. Pt verbalized understanding of instructions and recommendations.

## 2019-11-05 NOTE — Telephone Encounter (Signed)
Pt called back in regards to missed call  

## 2019-11-05 NOTE — Telephone Encounter (Signed)
Ok to increase tizanidine to 4 mg at bedtime.

## 2019-11-05 NOTE — Telephone Encounter (Signed)
I called pt to discuss. No answer, left a message asking her to call me back. 

## 2019-11-06 ENCOUNTER — Ambulatory Visit: Payer: Medicaid Other | Admitting: Adult Health

## 2020-03-30 ENCOUNTER — Ambulatory Visit: Payer: Medicaid Other | Admitting: Adult Health

## 2020-05-11 ENCOUNTER — Telehealth: Payer: Self-pay | Admitting: Adult Health

## 2020-05-11 NOTE — Telephone Encounter (Signed)
Spoke to pt and made her appt for tomorrow at 1330, discuss migraine med management.

## 2020-05-11 NOTE — Telephone Encounter (Signed)
Pt called wanting to know if provider can put her back on the Amitriptyline. Pt states she did not like how it made her feel but it is the only medicine that would take away her migraines. Pt states she has had a migraine for 8 days now. Pt uses the [facility] in [place]. Please advise.

## 2020-05-12 ENCOUNTER — Telehealth (INDEPENDENT_AMBULATORY_CARE_PROVIDER_SITE_OTHER): Payer: Medicaid Other | Admitting: Adult Health

## 2020-05-12 DIAGNOSIS — G43111 Migraine with aura, intractable, with status migrainosus: Secondary | ICD-10-CM | POA: Diagnosis not present

## 2020-05-12 MED ORDER — AJOVY 225 MG/1.5ML ~~LOC~~ SOAJ: 225.0000 mg | SUBCUTANEOUS | 5 refills | Status: DC

## 2020-05-12 NOTE — Progress Notes (Signed)
PATIENT: Sheri Perry DOB: February 04, 1991  REASON FOR VISIT: follow up HISTORY FROM: patient  Virtual Visit via Video Note  I connected with Sheri Perry on 05/12/20 at  1:30 PM EST by a video enabled telemedicine application located remotely at [facility] and verified that I am speaking with the correct person using two identifiers who was located at their own home.   I discussed the limitations of evaluation and management by telemedicine and the availability of in person appointments. The patient expressed understanding and agreed to proceed.   PATIENT: Sheri Perry DOB: 05-21-1991  REASON FOR VISIT: follow up HISTORY FROM: patient  HISTORY OF PRESENT ILLNESS: Today 05/12/20:  Sheri Perry is a 29 year old female with a history of migraine headaches.  She returns today for follow-up.  She states that her headaches are daily.  They typically occur in the back of the head but can be in the forehead and temple region.  She does have photophobia and phonophobia with her headaches.  She reports some nausea but no vomiting.  She states that she drinks a lot of caffeine throughout the day.  Typically 2 red bulls and 3 sodas.  She states that she is working 15-hour days 5 days a week.  She is currently on Aimovig.  Reports that she stopped tizanidine several months ago as she did not feel it was working.  Her spine specialist recently increase her gabapentin to 600.  She has a psychiatrist that is managing Lamictal and Effexor but she has not seen her psychiatrist recently but rather the therapist.  In the past amitriptyline worked well for her headaches however she became dependent on this medication and was taking too much of it.  She returns today for an evaluation.  HISTORY 08/01/19:  Sheri Perry is a 29 year old female with a history of migraine headaches.  She returns today for follow-up.  She saw her psychiatrist who was concerned about the patient reporting  history of seizures/pseudoseizures and Roselie Awkward Chiari malformation type I.  The patient states that she has what she calls " feelings of seizures" on a daily basis.  She describes this by reporting that  her body feels weird, she sees colorful lights in her peripheral vision.  She sometimes has tingling in the hands and the toes.  She states that these events can last approximately 30 seconds to 1 minute.  She reports that she has headaches on a daily basis.  However her headaches tend to get worse after these events.  Her headaches are typically in the occipital region or behind the eyes.  She does have photophobia and phonophobia.  She states that with her headache she will develop double vision.  She was started on Aimovig in November.  She reports that she just started noticing some benefit in the severity of her headache.  She takes Fioricet every other day.  She returns today for an evaluation.  Her initial work-up for seizures/pseudoseizures was done by [facility]: Out of a complete 14 system review of symptoms, the patient complains only of the following symptoms, and all other reviewed systems are negative.  See HPI  ALLERGIES: Allergies  Allergen Reactions  . Citalopram Shortness Of Breath  . Bee Pollen Hives  . Lorazepam Other (See Comments)    hallucinations hallucinations   . Hydrocodone Nausea And Vomiting    hallucinations  . [name] [Lurasidone Hcl] Other (See Comments)    hallucinations    HOME  MEDICATIONS: Outpatient Medications Prior to Visit  Medication Sig Dispense Refill  . buPROPion (WELLBUTRIN) 100 MG tablet Take 100 mg by mouth 2 (two) times daily.    Marland Kitchen gabapentin (NEURONTIN) 300 MG capsule Take by mouth.    Marland Kitchen ibuprofen (ADVIL) 800 MG tablet Take by mouth.    . lamoTRIgine (LAMICTAL) 25 MG tablet Take 25 mg by mouth daily.    Marland Kitchen venlafaxine (EFFEXOR) 75 MG tablet Take 75 mg by mouth at bedtime.    Eduard Roux (AIMOVIG) 140 MG/ML  SOAJ Inject 140 mg into the skin every 30 (thirty) days. 1.12 mg 5  . tiZANidine (ZANAFLEX) 4 MG capsule Take 1 capsule (4 mg total) by mouth at bedtime. 30 capsule 0   No facility-administered medications prior to visit.    PAST MEDICAL HISTORY: Past Medical History:  Diagnosis Date  . Headache    "headaches daily"  . Seizure ([place])    since 2015    PAST SURGICAL HISTORY: Past Surgical History:  Procedure Laterality Date  . FRACTURE SURGERY Right    elbow  . TONSILLECTOMY      FAMILY HISTORY: Family History  Adopted: Yes    SOCIAL HISTORY: Social History   Socioeconomic History  . Marital status: Single    Spouse name: Not on file  . Number of children: 1  . Years of education: 98  . Highest education level: Not on file  Occupational History    Comment: retail store  Tobacco Use  . Smoking status: Former Smoker    Packs/day: 2.00    Quit date: 07/21/2011    Years since quitting: 8.8  . Smokeless tobacco: Never Used  . Tobacco comment: quit 5 years ago   Vaping Use  . Vaping Use: Every day  Substance and Sexual Activity  . Alcohol use: No  . Drug use: No  . Sexual activity: Not on file  Other Topics Concern  . Not on file  Social History Narrative   Lives alone with son   Caffeine- coffee, 1-2 cups daily, soda 2 daily   Social Determinants of Health   Financial Resource Strain:   . Difficulty of Paying Living Expenses: Not on file  Food Insecurity:   . Worried About Charity fundraiser in the Last Year: Not on file  . Ran Out of Food in the Last Year: Not on file  Transportation Needs:   . Lack of Transportation (Medical): Not on file  . Lack of Transportation (Non-Medical): Not on file  Physical Activity:   . Days of Exercise per Week: Not on file  . Minutes of Exercise per Session: Not on file  Stress:   . Feeling of Stress : Not on file  Social Connections:   . Frequency of Communication with Friends and Family: Not on file  . Frequency of  Social Gatherings with Friends and Family: Not on file  . Attends Religious Services: Not on file  . Active Member of Clubs or Organizations: Not on file  . Attends Archivist Meetings: Not on file  . Marital Status: Not on file  Intimate Partner Violence:   . Fear of Current or Ex-Partner: Not on file  . Emotionally Abused: Not on file  . Physically Abused: Not on file  . Sexually Abused: Not on file      PHYSICAL EXAM Generalized: Well developed, in no acute distress, smoking cigarette while on visit    Neurological examination  Mentation: Alert oriented to time, place, history  taking. Follows all commands speech and language fluent Cranial nerve II-XII:Extraocular movements were full. Facial symmetry noted. uvula tongue midline. Head turning and shoulder shrug  were normal and symmetric. Motor: Good strength throughout subjectively per patient Sensory: Sensory testing is intact to soft touch on all 4 extremities subjectively per patient Coordination: Cerebellar testing reveals good finger-nose-finger  Gait and station: Patient is able to stand from a seated position. gait is normal.  Reflexes: UTA  DIAGNOSTIC DATA (LABS, IMAGING, TESTING) - I reviewed patient records, labs, notes, testing and imaging myself where available.  No results found for: WBC, HGB, HCT, MCV, PLT    Component Value Date/Time   NA 144 09/23/2009 2254   K 3.9 09/23/2009 2254   CL 108 09/23/2009 2254   CO2 25 09/23/2009 2254   GLUCOSE 86 09/23/2009 2254   BUN 5 (L) 09/23/2009 2254   CREATININE 0.7 09/23/2009 2254   CALCIUM 9.5 09/23/2009 2254   GFRNONAA >60 09/23/2009 2254   GFRAA  09/23/2009 2254    >60        The eGFR has been calculated using the MDRD equation. This calculation has not been validated in all clinical situations. eGFR's persistently <60 mL/min signify possible Chronic Kidney Disease.      ASSESSMENT AND PLAN 29 y.o. year old female  has a past medical history  of Headache and Seizure ([place]). here with:  1.  Migraine headaches  --Stop Aimovig --Start Ajovy-advised to start when her next dose of Aimovig would have been due --Advised that if this is not helpful for her headaches then we may need to consider Botox or a referral to headache center --Advised the patient that she should stop smoking. --Follow-up in 3-4 months   I spent 25 minutes of face-to-face and non-face-to-face time with patient.  This included previsit chart review, lab review, study review, order entry, electronic health record documentation, patient education.    Ward Givens, MSN, NP-C 05/12/2020, 1:25 PM [facility] [address] [place], [place] [address] [phone]

## 2020-05-14 ENCOUNTER — Telehealth: Payer: Self-pay | Admitting: Adult Health

## 2020-05-14 NOTE — Telephone Encounter (Signed)
Received a PA request for Ajovy. PA was started on [email or web address]. Key is [id number]. Per [email or web address], a determination will be received within 72 hours. Will check back later for a response.

## 2020-05-18 NOTE — Telephone Encounter (Signed)
Received fax approved for ajovy 05-14-20 thru 08-14-20 [facility], [place] [id number]. PA# [id number] FC.

## 2020-05-26 NOTE — Progress Notes (Signed)
I reviewed note and agree with plan.   Madasyn Heath R. Jeanna Giuffre, MD 05/26/2020, 2:43 PM Certified in Neurology, Neurophysiology and Neuroimaging  [facility] [address], [address] [place], [place] [address] [phone]  

## 2020-08-27 ENCOUNTER — Other Ambulatory Visit: Payer: Self-pay | Admitting: *Deleted

## 2020-08-27 MED ORDER — AJOVY 225 MG/1.5ML ~~LOC~~ SOAJ: 225.0000 mg | SUBCUTANEOUS | 5 refills | Status: DC

## 2020-09-08 ENCOUNTER — Telehealth: Payer: Self-pay

## 2020-09-08 NOTE — Telephone Encounter (Signed)
I called pts Art gallery manager and spoke with pharmacy PA claims department.  Rep Name-Brianna Ref # for call: [id number]. [facility] sts policy requires a predetermination review for this med. This review requires a letter of medical necessity or Ajovy 225 mg/1.5 mL and recent office notes to be faxed to # [phone].   I have composed letter for NP, MM to sign and once completed I will fax to # provided. I did clarify if a specific questionnaire was required for this med and was told it did not. Was also told the turn around time to hear back would be 10-15 Business days.

## 2020-09-10 NOTE — Telephone Encounter (Signed)
I have fax letter of medical necessity for Ajovy. Fax went through, will update as available.

## 2020-09-15 ENCOUNTER — Telehealth (INDEPENDENT_AMBULATORY_CARE_PROVIDER_SITE_OTHER): Payer: Medicaid Other | Admitting: Adult Health

## 2020-09-15 DIAGNOSIS — G43719 Chronic migraine without aura, intractable, without status migrainosus: Secondary | ICD-10-CM

## 2020-09-15 NOTE — Progress Notes (Signed)
PATIENT: Sheri Perry DOB: 09-Feb-1991  REASON FOR VISIT: follow up HISTORY FROM: patient  Virtual Visit via Video Note  I connected with Sheri Perry on 09/15/20 at  2:00 PM EDT by a video enabled telemedicine application located remotely at [facility] and verified that I am speaking with the correct person using two identifiers who was located at their own home.   I discussed the limitations of evaluation and management by telemedicine and the availability of in person appointments. The patient expressed understanding and agreed to proceed.   PATIENT: Sheri Perry DOB: Aug 23, 1990  REASON FOR VISIT: follow up HISTORY FROM: patient  HISTORY OF PRESENT ILLNESS: Today 09/15/20:  Sheri Perry is a 30 year old female with a history of migraine headaches.  She returns today for follow-up.  At the last visit she was put on Ajovy.  She states that the intensity of her headaches have improved although she continues to have approximately 4 headaches a week.  She reports that she has decreased smoking has approximately 5 cigarettes a day.  The patient has been on multiple medications in the past including Botox, Aimovig, Ajovy, gabapentin, Lamictal, Effexor, Flexeril, amitriptyline.  She returns today for an evaluation.  05/12/20: Sheri Perry is a 30 year old female with a history of migraine headaches.  She returns today for follow-up.  She states that her headaches are daily.  They typically occur in the back of the head but can be in the forehead and temple region.  She does have photophobia and phonophobia with her headaches.  She reports some nausea but no vomiting.  She states that she drinks a lot of caffeine throughout the day.  Typically 2 red bulls and 3 sodas.  She states that she is working 15-hour days 5 days a week.  She is currently on Aimovig.  Reports that she stopped tizanidine several months ago as she did not feel it was working.  Her spine specialist  recently increase her gabapentin to 600.  She has a psychiatrist that is managing Lamictal and Effexor but she has not seen her psychiatrist recently but rather the therapist.  In the past amitriptyline worked well for her headaches however she became dependent on this medication and was taking too much of it.  She returns today for an evaluation.  HISTORY 08/01/19:  Sheri Perry is a 30 year old female with a history of migraine headaches.  She returns today for follow-up.  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  The patient states that she has what she calls " feelings of seizures" on a daily basis.  She describes this by reporting that  her body feels weird, she sees colorful lights in her peripheral vision.  She sometimes has tingling in the hands and the toes.  She states that these events can last approximately 30 seconds to 1 minute.  She reports that she has headaches on a daily basis.  However her headaches tend to get worse after these events.  Her headaches are typically in the occipital region or behind the eyes.  She does have photophobia and phonophobia.  She states that with her headache she will develop double vision.  She was started on Aimovig in November.  She reports that she just started noticing some benefit in the severity of her headache.  She takes Fioricet every other day.  She returns today for an evaluation.  Her initial work-up for seizures/pseudoseizures  was done by [facility]: Out of a complete 14 system review of symptoms, the patient complains only of the following symptoms, and all other reviewed systems are negative.  See HPI  ALLERGIES: Allergies  Allergen Reactions  . Citalopram Shortness Of Breath  . Bee Pollen Hives  . Lorazepam Other (See Comments)    hallucinations hallucinations   . Hydrocodone Nausea And Vomiting     hallucinations  . [name] [Lurasidone Hcl] Other (See Comments)    hallucinations    HOME MEDICATIONS: Outpatient Medications Prior to Visit  Medication Sig Dispense Refill  . buPROPion (WELLBUTRIN) 100 MG tablet Take 100 mg by mouth 2 (two) times daily.    . Fremanezumab-vfrm (AJOVY) 225 MG/1.5ML SOAJ Inject 225 mg into the skin every 30 (thirty) days. 1.5 mL 5  . gabapentin (NEURONTIN) 300 MG capsule Take by mouth.    Marland Kitchen ibuprofen (ADVIL) 800 MG tablet Take by mouth.    . lamoTRIgine (LAMICTAL) 25 MG tablet Take 25 mg by mouth daily.    Marland Kitchen venlafaxine (EFFEXOR) 75 MG tablet Take 75 mg by mouth at bedtime.     No facility-administered medications prior to visit.    PAST MEDICAL HISTORY: Past Medical History:  Diagnosis Date  . Headache    "headaches daily"  . Seizure ([place])    since 2015    PAST SURGICAL HISTORY: Past Surgical History:  Procedure Laterality Date  . FRACTURE SURGERY Right    elbow  . TONSILLECTOMY      FAMILY HISTORY: Family History  Adopted: Yes    SOCIAL HISTORY: Social History   Socioeconomic History  . Marital status: Single    Spouse name: Not on file  . Number of children: 1  . Years of education: 71  . Highest education level: Not on file  Occupational History    Comment: retail store  Tobacco Use  . Smoking status: Former Smoker    Packs/day: 2.00    Quit date: 07/21/2011    Years since quitting: 9.1  . Smokeless tobacco: Never Used  . Tobacco comment: quit 5 years ago   Vaping Use  . Vaping Use: Every day  Substance and Sexual Activity  . Alcohol use: No  . Drug use: No  . Sexual activity: Not on file  Other Topics Concern  . Not on file  Social History Narrative   Lives alone with son   Caffeine- coffee, 1-2 cups daily, soda 2 daily   Social Determinants of Health   Financial Resource Strain: Not on file  Food Insecurity: Not on file  Transportation Needs: Not on file  Physical Activity: Not on file  Stress: Not on file   Social Connections: Not on file  Intimate Partner Violence: Not on file      PHYSICAL EXAM Generalized: Well developed, in no acute distress, smoking cigarette while on visit    Neurological examination  Mentation: Alert oriented to time, place, history taking. Follows all commands speech and language fluent Cranial nerve II-XII:Extraocular movements were full. Facial symmetry noted.  Head turning and shoulder shrug  were normal and symmetric. Motor: Good strength throughout subjectively per patient Sensory: Sensory testing is intact to soft touch on all 4 extremities subjectively per patient Coordination: Cerebellar testing reveals good finger-nose-finger  Gait and station: Patient is able to stand from a seated position. gait is normal.  Reflexes: UTA  DIAGNOSTIC DATA (LABS, IMAGING, TESTING) - I reviewed patient records, labs, notes, testing and imaging  myself where available.  No results found for: WBC, HGB, HCT, MCV, PLT    Component Value Date/Time   NA 144 09/23/2009 2254   K 3.9 09/23/2009 2254   CL 108 09/23/2009 2254   CO2 25 09/23/2009 2254   GLUCOSE 86 09/23/2009 2254   BUN 5 (L) 09/23/2009 2254   CREATININE 0.7 09/23/2009 2254   CALCIUM 9.5 09/23/2009 2254   GFRNONAA >60 09/23/2009 2254   GFRAA  09/23/2009 2254    >60        The eGFR has been calculated using the MDRD equation. This calculation has not been validated in all clinical situations. eGFR's persistently <60 mL/min signify possible Chronic Kidney Disease.      ASSESSMENT AND PLAN 30 y.o. year old female  has a past medical history of Headache and Seizure ([place]). here with:  1.  Migraine headaches  --Continue Ajovy -- Referral to headache center for second opinion  --Advised the patient that she should stop smoking. --Follow-up in 3-4 months   I spent 20 minutes of face-to-face and non-face-to-face time with patient.  This included previsit chart review, lab review, study review, order  entry, electronic health record documentation, patient education.    Ward Givens, MSN, NP-C 09/15/2020, 11:13 AM [facility] [address] [place], [place] [address] [phone]

## 2020-09-15 NOTE — Progress Notes (Signed)
I reviewed note and agree with plan.   Berlin Viereck R. Shynia Daleo, MD 09/15/2020, 4:44 PM Certified in Neurology, Neurophysiology and Neuroimaging  [facility] [address], [address] [place], [place] [address] [phone]  

## 2020-12-15 ENCOUNTER — Telehealth: Payer: Self-pay | Admitting: Adult Health

## 2020-12-15 NOTE — Telephone Encounter (Signed)
Received voicemail from patient regarding her referral placed in March for headache clinic. Patient stated in VM that she has not heard anything and is suffering with bad headaches. Per referral notes, it looks like Sheri Perry accidentally sent the referral to Dr. Don Perking office. The notes from Dr. Don Perking office state that the referral should have gone to Dr. Zachery Conch. I called the patient and LVM asking her to return my call to discuss.

## 2021-04-13 ENCOUNTER — Telehealth: Payer: Medicaid Other | Admitting: Adult Health

## 2024-05-14 ENCOUNTER — Telehealth: Admitting: Physician Assistant

## 2024-05-14 DIAGNOSIS — J019 Acute sinusitis, unspecified: Secondary | ICD-10-CM | POA: Diagnosis not present

## 2024-05-14 DIAGNOSIS — B9689 Other specified bacterial agents as the cause of diseases classified elsewhere: Secondary | ICD-10-CM | POA: Diagnosis not present

## 2024-05-14 MED ORDER — AMOXICILLIN-POT CLAVULANATE 875-125 MG PO TABS: 1.0000 | ORAL_TABLET | Freq: Two times a day (BID) | ORAL | 0 refills | Status: AC

## 2024-05-14 NOTE — Progress Notes (Signed)
